# Patient Record
Sex: Female | Born: 1960
Health system: Southern US, Community
[De-identification: ages and names within clinical notes are randomized; demographics above are authoritative.]

## PROBLEM LIST (undated history)

## (undated) DIAGNOSIS — T7840XA Allergy, unspecified, initial encounter: Secondary | ICD-10-CM

## (undated) DIAGNOSIS — I10 Essential (primary) hypertension: Secondary | ICD-10-CM

## (undated) HISTORY — DX: Allergy, unspecified, initial encounter: T78.40XA

## (undated) HISTORY — DX: Essential (primary) hypertension: I10

---

## 1999-09-15 ENCOUNTER — Other Ambulatory Visit: Admission: RE | Admit: 1999-09-15 | Discharge: 1999-09-15 | Payer: Self-pay | Admitting: *Deleted

## 2000-06-19 ENCOUNTER — Other Ambulatory Visit: Admission: RE | Admit: 2000-06-19 | Discharge: 2000-06-19 | Payer: Self-pay | Admitting: *Deleted

## 2000-06-19 ENCOUNTER — Encounter (INDEPENDENT_AMBULATORY_CARE_PROVIDER_SITE_OTHER): Payer: Self-pay

## 2001-01-01 ENCOUNTER — Other Ambulatory Visit: Admission: RE | Admit: 2001-01-01 | Discharge: 2001-01-01 | Payer: Self-pay | Admitting: *Deleted

## 2001-12-18 ENCOUNTER — Other Ambulatory Visit: Admission: RE | Admit: 2001-12-18 | Discharge: 2001-12-18 | Payer: Self-pay | Admitting: *Deleted

## 2003-01-26 ENCOUNTER — Other Ambulatory Visit: Admission: RE | Admit: 2003-01-26 | Discharge: 2003-01-26 | Payer: Self-pay | Admitting: *Deleted

## 2004-01-12 ENCOUNTER — Other Ambulatory Visit: Admission: RE | Admit: 2004-01-12 | Discharge: 2004-01-12 | Payer: Self-pay | Admitting: *Deleted

## 2005-08-03 ENCOUNTER — Other Ambulatory Visit: Admission: RE | Admit: 2005-08-03 | Discharge: 2005-08-03 | Payer: Self-pay | Admitting: *Deleted

## 2006-09-03 ENCOUNTER — Other Ambulatory Visit: Admission: RE | Admit: 2006-09-03 | Discharge: 2006-09-03 | Payer: Self-pay | Admitting: *Deleted

## 2006-11-30 ENCOUNTER — Emergency Department (HOSPITAL_COMMUNITY): Admission: EM | Admit: 2006-11-30 | Discharge: 2006-11-30 | Payer: Self-pay | Admitting: Emergency Medicine

## 2007-10-23 ENCOUNTER — Other Ambulatory Visit: Admission: RE | Admit: 2007-10-23 | Discharge: 2007-10-23 | Payer: Self-pay | Admitting: *Deleted

## 2012-05-07 ENCOUNTER — Telehealth: Payer: Self-pay | Admitting: *Deleted

## 2012-05-07 NOTE — Telephone Encounter (Signed)
Confirmed genetic appt w/ pt.

## 2012-06-24 ENCOUNTER — Ambulatory Visit (HOSPITAL_BASED_OUTPATIENT_CLINIC_OR_DEPARTMENT_OTHER): Payer: BC Managed Care – PPO | Admitting: Genetic Counselor

## 2012-06-24 ENCOUNTER — Encounter: Payer: Self-pay | Admitting: Genetic Counselor

## 2012-06-24 ENCOUNTER — Other Ambulatory Visit: Payer: BC Managed Care – PPO | Admitting: Lab

## 2012-06-24 DIAGNOSIS — Z803 Family history of malignant neoplasm of breast: Secondary | ICD-10-CM

## 2012-06-24 DIAGNOSIS — Z808 Family history of malignant neoplasm of other organs or systems: Secondary | ICD-10-CM

## 2012-06-24 DIAGNOSIS — Z8481 Family history of carrier of genetic disease: Secondary | ICD-10-CM

## 2012-06-24 DIAGNOSIS — Z800 Family history of malignant neoplasm of digestive organs: Secondary | ICD-10-CM

## 2012-06-24 NOTE — Progress Notes (Signed)
Dr.  Teodora Medici requested a consultation for genetic counseling and risk assessment for Barbara Gonzalez, a 51 y.o. female, for discussion of her family history of a BRCA2 mutation. She presents to clinic today to discuss the possibility of a genetic predisposition to cancer, and to further clarify her risks, as well as her family members' risks for cancer.   HISTORY OF PRESENT ILLNESS: Barbara Gonzalez is a 51 y.o. female with no personal history of cancer.    History reviewed. No pertinent past medical history.  History reviewed. No pertinent past surgical history.  History  Substance Use Topics  . Smoking status: Never Smoker   . Smokeless tobacco: Never Used  . Alcohol Use: Yes     Comment: socially    REPRODUCTIVE HISTORY AND PERSONAL RISK ASSESSMENT FACTORS: Menarche was at age 17.   Premenopausal Uterus Intact: No Ovaries Intact: Yes [id number] , first live birth at age N/A  She has not previously undergone treatment for infertility.   OCP use for 15+ years   She has not used HRT in the past.    FAMILY HISTORY:  We obtained a detailed, 4-generation family history.  Significant diagnoses are listed below: Family History  Problem Relation Age of Onset  . Breast cancer Mother 64  . Thyroid cancer Mother 28  . Colon cancer Mother 65  . Breast cancer Sister 59    maternal half sister  . BRCA 1/2 Sister     BRCA2 positive  . Cancer Maternal Aunt     unknown cancer  . Kidney cancer Maternal Uncle   . Colon cancer Maternal Grandmother   . Cancer Maternal Aunt     unknown cancer  . Breast cancer Other     3 maternal great aunts with breast cancer  The patient's maternal half sister was diagnosed with breast cancer at age 71.  She was found to have a Suspected deleterious BRCA2 mutation.  The patient has two brothers, one full and one half, who are healthy.  The patient's mother was diagnosed with thyroid cancer and colon cancer at age 67 and breast cancer at 60.  She has a brother  and two sisters.  The two sisters died of unknown cancers.  The patient's maternal grandmother had colon cancer and died at 70.  This grandmother had three sisters who had breast cancer.  Patient's maternal ancestors are of African American descent, and paternal ancestors are of African American descent. There is no reported Ashkenazi Jewish ancestry. There is no  known consanguinity.  GENETIC COUNSELING RISK ASSESSMENT, DISCUSSION, AND SUGGESTED FOLLOW UP: We reviewed the natural history and genetic etiology of sporadic, familial and hereditary cancer syndromes.  We discussed that she would be tested for the BRCA2 mutation that was found in her sister.  Because she and her sister are half siblings, her risk for testing positive for the mutation found in her sister is 25%.  We discussed that we would bring her in to discuss her findings if she did test positive.  The patient's family history is suggestive of the following possible diagnosis: hereditary breast and ovarian cancer syndrome (HBOC)  We discussed that identification of a hereditary cancer syndrome may help her care providers tailor the patients medical management. If a mutation indicating HBOC is detected in this case, the [employer] recommendations would include increase cancer surveillance and possible prophylactic surgery. If a mutation is detected, the patient will be referred back to the referring provider and to any  additional appropriate care providers to discuss the relevant options.   If a mutation is not found in the patient, cancer surveillance options would be discussed for the patient according to the appropriate standard National Comprehensive Cancer Network and American Cancer Society guidelines, with consideration of their personal and family history risk factors. In this case, the patient will be referred back to their care providers for discussions of management.   After considering the risks,  benefits, and limitations, the patient provided informed consent for  the following  testing: single site BRCA2 testing through [facility].   Per the patient's request, we will contact her by telephone to discuss these results. A follow up genetic counseling visit will be scheduled if indicated.  The patient was seen for a total of 60 minutes, greater than 50% of which was spent face-to-face counseling.  This plan is being carried out per Dr. Teodora Medici recommendations.  This note will also be sent to the referring provider via the electronic medical record. The patient will be supplied with a summary of this genetic counseling discussion as well as educational information on the discussed hereditary cancer syndromes following the conclusion of their visit.   Patient was discussed with Dr. Drue Second.  _______________________________________________________________________ For Office Staff:  Number of people involved in session: 3 Was an Intern/ student involved with case: yes

## 2012-07-05 ENCOUNTER — Telehealth: Payer: Self-pay | Admitting: Genetic Counselor

## 2012-07-05 ENCOUNTER — Encounter: Payer: Self-pay | Admitting: Genetic Counselor

## 2012-07-05 NOTE — Telephone Encounter (Signed)
Discussed negative single site testing.  Also discussed next steps in testing her family, including testing her mother, or if she refuses, testing her two brothers.  She will talk with her sister about talking to their mother about testing.

## 2012-07-30 ENCOUNTER — Encounter: Payer: Self-pay | Admitting: Genetic Counselor

## 2013-04-02 ENCOUNTER — Other Ambulatory Visit: Payer: Self-pay | Admitting: Gynecology

## 2013-04-02 DIAGNOSIS — Z803 Family history of malignant neoplasm of breast: Secondary | ICD-10-CM

## 2013-05-13 ENCOUNTER — Other Ambulatory Visit: Payer: BC Managed Care – PPO

## 2013-05-22 ENCOUNTER — Ambulatory Visit
Admission: RE | Admit: 2013-05-22 | Discharge: 2013-05-22 | Disposition: A | Discharge status: Home / Self Care | Payer: BC Managed Care – PPO | Source: Ambulatory Visit | Attending: Gynecology | Admitting: Gynecology

## 2013-05-22 DIAGNOSIS — Z803 Family history of malignant neoplasm of breast: Secondary | ICD-10-CM

## 2013-05-22 MED ORDER — GADOBENATE DIMEGLUMINE 529 MG/ML IV SOLN
15.0000 mL | Freq: Once | INTRAVENOUS | Status: AC | PRN
  Administered 2013-05-22: 15 mL via INTRAVENOUS

## 2013-12-07 ENCOUNTER — Ambulatory Visit (INDEPENDENT_AMBULATORY_CARE_PROVIDER_SITE_OTHER): Payer: BC Managed Care – PPO | Admitting: Physician Assistant

## 2013-12-07 VITALS — BP 120/70 | HR 71 | Temp 98.4°F | Resp 12 | Ht 62.0 in | Wt 170.0 lb

## 2013-12-07 DIAGNOSIS — R059 Cough, unspecified: Secondary | ICD-10-CM

## 2013-12-07 DIAGNOSIS — R05 Cough: Secondary | ICD-10-CM

## 2013-12-07 DIAGNOSIS — R0602 Shortness of breath: Secondary | ICD-10-CM

## 2013-12-07 DIAGNOSIS — J329 Chronic sinusitis, unspecified: Secondary | ICD-10-CM

## 2013-12-07 MED ORDER — HYDROCODONE-HOMATROPINE 5-1.5 MG/5ML PO SYRP: 5.0000 mL | ORAL_SOLUTION | Freq: Three times a day (TID) | ORAL | Status: DC | PRN

## 2013-12-07 MED ORDER — PREDNISONE 20 MG PO TABS: 20.0000 mg | ORAL_TABLET | Freq: Every day | ORAL | Status: DC

## 2013-12-07 MED ORDER — AMOXICILLIN-POT CLAVULANATE 875-125 MG PO TABS: 1.0000 | ORAL_TABLET | Freq: Two times a day (BID) | ORAL | Status: DC

## 2013-12-07 NOTE — Progress Notes (Signed)
   Subjective:    Patient ID: Barbara Gonzalez, female    DOB: [birth year], 53 y.o.   MRN: [id number]  HPI 53 year old female presents for evaluation of 1 week history of progressively worsening sinus pain/pressure, nasal congestion, rhinorrhea, PND, and cough. Has now developed SOB and fatigue. Subjective fever last night but not documented.  Hx of seasonal allergies normally treated with Zyrtec prn.  Denies hemoptysis, nausea, vomiting, dizziness, sore throat, or wheezing.  Does have slight ear fullness/pain and sinus headache.  She has been taking Zyrtec, Alka seltzer and ibuprofen which have not been helping.  Patient is otherwise doing well with no other concerns today.      Review of Systems  Constitutional: Positive for fever (subjective) and chills.  HENT: Positive for congestion, ear pain, postnasal drip, rhinorrhea and sinus pressure. Negative for sore throat.   Respiratory: Positive for cough and shortness of breath. Negative for chest tightness and wheezing.   Cardiovascular: Negative for chest pain.  Gastrointestinal: Negative for nausea and vomiting.  Neurological: Negative for dizziness and headaches.       Objective:   Physical Exam  Constitutional: She is oriented to person, place, and time. She appears well-developed and well-nourished.  HENT:  Head: Normocephalic and atraumatic.  Right Ear: Hearing, tympanic membrane, external ear and ear canal normal.  Left Ear: Hearing, tympanic membrane, external ear and ear canal normal.  Nose: Right sinus exhibits maxillary sinus tenderness. Right sinus exhibits no frontal sinus tenderness. Left sinus exhibits maxillary sinus tenderness. Left sinus exhibits no frontal sinus tenderness.  Mouth/Throat: Uvula is midline, oropharynx is clear and moist and mucous membranes are normal.  Eyes: Conjunctivae are normal.  Neck: Normal range of motion. Neck supple.  Cardiovascular: Normal rate, regular rhythm and normal heart sounds.     Pulmonary/Chest: Effort normal and breath sounds normal.  Lymphadenopathy:    She has no cervical adenopathy.  Neurological: She is alert and oriented to person, place, and time.  Psychiatric: She has a normal mood and affect. Her behavior is normal. Judgment and thought content normal.          Assessment & Plan:  Sinus infection - Plan: amoxicillin-clavulanate (AUGMENTIN) 875-125 MG per tablet  Cough - Plan: HYDROcodone-homatropine (HYCODAN) 5-1.5 MG/5ML syrup  SOB (shortness of breath) - Plan: predniSONE (DELTASONE) 20 MG tablet  Will treat as acute maxillary sinusitis with Augmentin 875 mg bid x 10 days and prednisone taper Hycodan q8hours prn cough. Continue Zyrtec daily  Push fluids Out of work tomorrow if needed.  Follow up if symptoms worsening or fail to improve.

## 2014-02-04 ENCOUNTER — Ambulatory Visit (INDEPENDENT_AMBULATORY_CARE_PROVIDER_SITE_OTHER): Payer: BC Managed Care – PPO | Admitting: Family Medicine

## 2014-02-04 VITALS — BP 128/86 | HR 81 | Temp 97.9°F | Resp 16 | Ht 62.25 in | Wt 170.8 lb

## 2014-02-04 DIAGNOSIS — T798XXA Other early complications of trauma, initial encounter: Secondary | ICD-10-CM

## 2014-02-04 DIAGNOSIS — T148XXA Other injury of unspecified body region, initial encounter: Secondary | ICD-10-CM

## 2014-02-04 DIAGNOSIS — L089 Local infection of the skin and subcutaneous tissue, unspecified: Secondary | ICD-10-CM

## 2014-02-04 MED ORDER — DOXYCYCLINE HYCLATE 100 MG PO TABS: 100.0000 mg | ORAL_TABLET | Freq: Two times a day (BID) | ORAL | Status: DC

## 2014-02-04 NOTE — Progress Notes (Signed)
Chief Complaint:  Chief Complaint  Patient presents with  . Knee Pain    L knee wound- injured scrubbing floors 8-10 days ago. sore/ yellow drainage    HPI: Barbara Gonzalez is a 53 y.o. female who is here for  10 day history of left wound on knee after on knees scrubbing the floor, she had it on her other knee as well but the wound healed. She had what sounded like a . Black eschar  And it was draining serosainguinous fluid and since it was draining she just pulled it off. She states the borders are slightly "red and puffy and tender". No fevers or chills. She has been putting alchol on it. Ther knee healed well.   Past Medical History  Diagnosis Date  . Hypertension   . Allergy    History reviewed. No pertinent past surgical history. History   Social History  . Marital Status: Single    Spouse Name: N/A    Number of Children: N/A  . Years of Education: N/A   Social History Main Topics  . Smoking status: Never Smoker   . Smokeless tobacco: Never Used  . Alcohol Use: Yes     Comment: socially  . Drug Use: No  . Sexual Activity:    Other Topics Concern  . None   Social History Narrative  . None   Family History  Problem Relation Age of Onset  . Breast cancer Mother 71  . Thyroid cancer Mother 71  . Colon cancer Mother 40  . Breast cancer Sister 46    maternal half sister  . BRCA 1/2 Sister     BRCA2 positive  . Cancer Maternal Aunt     unknown cancer  . Kidney cancer Maternal Uncle   . Colon cancer Maternal Grandmother   . Cancer Maternal Aunt     unknown cancer  . Breast cancer Other     3 maternal great aunts with breast cancer   Allergies  Allergen Reactions  . Sulfa Antibiotics Itching   Prior to Admission medications   Medication Sig Start Date End Date Taking? Authorizing Provider  lisinopril (PRINIVIL,ZESTRIL) 5 MG tablet Take 5 mg by mouth daily.   Yes Historical Provider, MD  LORazepam (ATIVAN) 2 MG tablet Take 2 mg by mouth every 6 (six)  hours as needed for anxiety.   Yes Historical Provider, MD  phentermine 15 MG capsule Take 15 mg by mouth every morning.   Yes Historical Provider, MD  traZODone (DESYREL) 50 MG tablet Take 50 mg by mouth at bedtime.   Yes Historical Provider, MD  amoxicillin-clavulanate (AUGMENTIN) 875-125 MG per tablet Take 1 tablet by mouth 2 (two) times daily. 12/07/13   Heather M Marte, PA-C  HYDROcodone-homatropine (HYCODAN) 5-1.5 MG/5ML syrup Take 5 mLs by mouth every 8 (eight) hours as needed for cough. 12/07/13   Collene Leyden, PA-C  predniSONE (DELTASONE) 20 MG tablet Take 1 tablet (20 mg total) by mouth daily with breakfast. Take 2 tablets for 3 days, then 1 tablet for 3 days 12/07/13   Collene Leyden, PA-C     ROS: The patient denies fevers, chills, night sweats, unintentional weight loss, chest pain, palpitations, wheezing, dyspnea on exertion, nausea, vomiting, abdominal pain, dysuria, hematuria, melena, numbness, weakness, or tingling.   All other systems have been reviewed and were otherwise negative with the exception of those mentioned in the HPI and as above.    PHYSICAL EXAM: Filed Vitals:  02/04/14 1745  BP: 128/86  Pulse: 81  Temp: 97.9 F (36.6 C)  Resp: 16   Filed Vitals:   02/04/14 1745  Height: 5' 2.25" (1.581 m)  Weight: 170 lb 12.8 oz (77.474 kg)   Body mass index is 31 kg/(m^2).  General: Alert, no acute distress HEENT:  Normocephalic, atraumatic, oropharynx patent. EOMI, PERRLA Cardiovascular:  Regular rate and rhythm, no rubs murmurs or gallops.  No Carotid bruits, radial pulse intact. No pedal edema.  Respiratory: Clear to auscultation bilaterally.  No wheezes, rales, or rhonchi.  No cyanosis, no use of accessory musculature GI: No organomegaly, abdomen is soft and non-tender, positive bowel sounds.  No masses. Skin: + left wound on knee , healing well, she has a dry wound with minimal erythema form wound contracture and healng. It is dry and clean. No appreciable  warmth. There is minimal redness and fluid around borders to be expected Neurologic: Facial musculature symmetric. Psychiatric: Patient is appropriate throughout our interaction. Lymphatic: No cervical lymphadenopathy Musculoskeletal: Gait intact.   LABS: No results found for this or any previous visit.   EKG/XRAY:   Primary read interpreted by Dr. Marin Comment at [facility].   ASSESSMENT/PLAN: Encounter Diagnosis  Name Primary?  . Post-traumatic wound infection, initial encounter Yes   IF no improvement then will take doxycyline, gave pt prescriptions to hold onto.I suspect she will not need it.  Wound care as directed/precautions given for worsening sxs and how long it will take to heal F/u prn  Gross sideeffects, risk and benefits, and alternatives of medications d/w patient. Patient is aware that all medications have potential sideeffects and we are unable to predict every sideeffect or drug-drug interaction that may occur.  Leotis Pain, DO 02/04/2014 6:33 PM

## 2014-10-08 ENCOUNTER — Ambulatory Visit (INDEPENDENT_AMBULATORY_CARE_PROVIDER_SITE_OTHER): Payer: BLUE CROSS/BLUE SHIELD | Admitting: Family Medicine

## 2014-10-08 ENCOUNTER — Encounter: Payer: Self-pay | Admitting: Family Medicine

## 2014-10-08 VITALS — BP 118/80 | HR 73 | Temp 97.4°F | Resp 16 | Ht 62.5 in | Wt 170.6 lb

## 2014-10-08 DIAGNOSIS — M546 Pain in thoracic spine: Secondary | ICD-10-CM | POA: Diagnosis not present

## 2014-10-08 DIAGNOSIS — R0789 Other chest pain: Secondary | ICD-10-CM

## 2014-10-08 DIAGNOSIS — M79602 Pain in left arm: Secondary | ICD-10-CM

## 2014-10-08 DIAGNOSIS — F419 Anxiety disorder, unspecified: Secondary | ICD-10-CM | POA: Diagnosis not present

## 2014-10-08 DIAGNOSIS — M25512 Pain in left shoulder: Secondary | ICD-10-CM

## 2014-10-08 DIAGNOSIS — M25511 Pain in right shoulder: Secondary | ICD-10-CM | POA: Diagnosis not present

## 2014-10-08 DIAGNOSIS — M549 Dorsalgia, unspecified: Secondary | ICD-10-CM

## 2014-10-08 MED ORDER — CYCLOBENZAPRINE HCL 5 MG PO TABS: 10.0000 mg | ORAL_TABLET | Freq: Every day | ORAL | Status: DC

## 2014-10-08 MED ORDER — DICLOFENAC SODIUM 75 MG PO TBEC: 75.0000 mg | DELAYED_RELEASE_TABLET | Freq: Two times a day (BID) | ORAL | Status: DC

## 2014-10-08 NOTE — Patient Instructions (Signed)
Do regular stretching of your upper back and shoulders as instructed  Take the cyclobenzaprine 5 mg 1 at bedtime for muscle relaxant  Take the diclofenac one twice daily at breakfast and supper for pain and inflammation  Try get some regular daily exercise such as taking a good walk every day  Return if any time if acutely worse, or if not improving over the next few weeks.

## 2014-10-08 NOTE — Progress Notes (Signed)
Subjective: Patient been having some pain in her left upper arm for the last couple of weeks. It on nipping to the shoulder and mid upper back. Today it also hurt her in the right upper shoulder and back. She's had some pressure-like feelings in her chest when she takes a deep breath. She is on blood pressure medications. She also has a history of anxiety and has been on medications for that. She is able to run up and down the stairs at home fine without pains. She has no excessive shortness of breath. She does not get any regular exercise. She works a Office managerdesk job. She has had no nausea or vomiting or GI complaints.  Objective: Pleasant, slightly anxious appearing lady in no acute distress. Her neck is supple. Chest clear to auscultation. Heart regular without murmurs. Chest wall is a little bit tender anteriorly on palpation. She's also tender and tight in the trapezius muscles of her upper back.  Assessment: Upper arm pains Shoulder pains Chest pain Anxiety  Plan: EKG Normal EKG  Treat with diclofenac and Robaxin and stretches

## 2014-11-04 ENCOUNTER — Emergency Department (HOSPITAL_COMMUNITY): Payer: BLUE CROSS/BLUE SHIELD

## 2014-11-04 ENCOUNTER — Encounter (HOSPITAL_COMMUNITY): Payer: Self-pay | Admitting: Emergency Medicine

## 2014-11-04 ENCOUNTER — Observation Stay (HOSPITAL_COMMUNITY)
Admission: EM | Admit: 2014-11-04 | Discharge: 2014-11-04 | Disposition: A | Discharge status: Home / Self Care | Payer: BLUE CROSS/BLUE SHIELD | Attending: Internal Medicine | Admitting: Internal Medicine

## 2014-11-04 DIAGNOSIS — I10 Essential (primary) hypertension: Secondary | ICD-10-CM | POA: Diagnosis not present

## 2014-11-04 DIAGNOSIS — F411 Generalized anxiety disorder: Secondary | ICD-10-CM | POA: Diagnosis not present

## 2014-11-04 DIAGNOSIS — R0789 Other chest pain: Principal | ICD-10-CM | POA: Insufficient documentation

## 2014-11-04 DIAGNOSIS — Z803 Family history of malignant neoplasm of breast: Secondary | ICD-10-CM | POA: Insufficient documentation

## 2014-11-04 DIAGNOSIS — R079 Chest pain, unspecified: Secondary | ICD-10-CM | POA: Diagnosis not present

## 2014-11-04 DIAGNOSIS — Z881 Allergy status to other antibiotic agents status: Secondary | ICD-10-CM | POA: Diagnosis not present

## 2014-11-04 DIAGNOSIS — Z800 Family history of malignant neoplasm of digestive organs: Secondary | ICD-10-CM | POA: Diagnosis not present

## 2014-11-04 DIAGNOSIS — Z808 Family history of malignant neoplasm of other organs or systems: Secondary | ICD-10-CM | POA: Diagnosis not present

## 2014-11-04 LAB — BASIC METABOLIC PANEL
ANION GAP: 8 (ref 5–15)
BUN: 14 mg/dL (ref 6–23)
CO2: 26 mmol/L (ref 19–32)
Calcium: 9.1 mg/dL (ref 8.4–10.5)
Chloride: 105 mmol/L (ref 96–112)
Creatinine, Ser: 1.06 mg/dL (ref 0.50–1.10)
GFR, EST AFRICAN AMERICAN: 68 mL/min — AB (ref >90)
GFR, EST NON AFRICAN AMERICAN: 59 mL/min — AB (ref >90)
Glucose, Bld: 98 mg/dL (ref 70–99)
POTASSIUM: 4.2 mmol/L (ref 3.5–5.1)
SODIUM: 139 mmol/L (ref 135–145)

## 2014-11-04 LAB — CBC WITH DIFFERENTIAL/PLATELET
BASOS ABS: 0 10*3/uL (ref 0.0–0.1)
BASOS PCT: 0 % (ref 0–1)
EOS ABS: 0.3 10*3/uL (ref 0.0–0.7)
EOS PCT: 3 % (ref 0–5)
HCT: 39.6 % (ref 36.0–46.0)
HEMOGLOBIN: 13.2 g/dL (ref 12.0–15.0)
Lymphocytes Relative: 24 % (ref 12–46)
Lymphs Abs: 2.7 10*3/uL (ref 0.7–4.0)
MCH: 32 pg (ref 26.0–34.0)
MCHC: 33.3 g/dL (ref 30.0–36.0)
MCV: 95.9 fL (ref 78.0–100.0)
Monocytes Absolute: 1 10*3/uL (ref 0.1–1.0)
Monocytes Relative: 9 % (ref 3–12)
Neutro Abs: 7.4 10*3/uL (ref 1.7–7.7)
Neutrophils Relative %: 64 % (ref 43–77)
PLATELETS: 290 10*3/uL (ref 150–400)
RBC: 4.13 MIL/uL (ref 3.87–5.11)
RDW: 13.7 % (ref 11.5–15.5)
WBC: 11.4 10*3/uL — ABNORMAL HIGH (ref 4.0–10.5)

## 2014-11-04 LAB — I-STAT CHEM 8, ED
BUN: 18 mg/dL (ref 6–23)
CALCIUM ION: 1.14 mmol/L (ref 1.12–1.23)
CHLORIDE: 102 mmol/L (ref 96–112)
Creatinine, Ser: 1.1 mg/dL (ref 0.50–1.10)
Glucose, Bld: 98 mg/dL (ref 70–99)
HEMATOCRIT: 44 % (ref 36.0–46.0)
Hemoglobin: 15 g/dL (ref 12.0–15.0)
POTASSIUM: 5.3 mmol/L — AB (ref 3.5–5.1)
Sodium: 139 mmol/L (ref 135–145)
TCO2: 24 mmol/L (ref 0–100)

## 2014-11-04 LAB — TROPONIN I
Troponin I: <0.03 ng/mL
Troponin I: <0.03 ng/mL (ref <0.031)
Troponin I: <0.03 ng/mL

## 2014-11-04 LAB — I-STAT TROPONIN, ED: TROPONIN I, POC: 0 ng/mL (ref 0.00–0.08)

## 2014-11-04 MED ORDER — LISINOPRIL-HYDROCHLOROTHIAZIDE 20-12.5 MG PO TABS: 1.0000 | ORAL_TABLET | Freq: Every day | ORAL | Status: DC

## 2014-11-04 MED ORDER — HEPARIN SODIUM (PORCINE) 5000 UNIT/ML IJ SOLN
5000.0000 [IU] | Freq: Three times a day (TID) | INTRAMUSCULAR | Status: DC
  Administered 2014-11-04: 5000 [IU] via SUBCUTANEOUS
  Filled 2014-11-04: qty 1

## 2014-11-04 MED ORDER — TRAZODONE HCL 50 MG PO TABS: 100.0000 mg | ORAL_TABLET | Freq: Every day | ORAL | Status: DC

## 2014-11-04 MED ORDER — TRAZODONE HCL 50 MG PO TABS: 50.0000 mg | ORAL_TABLET | Freq: Every day | ORAL | Status: DC

## 2014-11-04 MED ORDER — ASPIRIN 325 MG PO TABS
325.0000 mg | ORAL_TABLET | Freq: Once | ORAL | Status: AC
  Administered 2014-11-04: 325 mg via ORAL
  Filled 2014-11-04: qty 1

## 2014-11-04 MED ORDER — LORAZEPAM 1 MG PO TABS
2.0000 mg | ORAL_TABLET | Freq: Four times a day (QID) | ORAL | Status: DC | PRN
Start: 2014-11-04 — End: 2014-11-04

## 2014-11-04 MED ORDER — LORAZEPAM 1 MG PO TABS: 1.0000 mg | ORAL_TABLET | Freq: Two times a day (BID) | ORAL | Status: DC | PRN

## 2014-11-04 MED ORDER — OMEPRAZOLE 20 MG PO CPDR: 20.0000 mg | DELAYED_RELEASE_CAPSULE | Freq: Every day | ORAL | Status: DC

## 2014-11-04 MED ORDER — ONDANSETRON HCL 4 MG/2ML IJ SOLN: 4.0000 mg | Freq: Four times a day (QID) | INTRAMUSCULAR | Status: DC | PRN

## 2014-11-04 MED ORDER — HYDROCHLOROTHIAZIDE 25 MG PO TABS
25.0000 mg | ORAL_TABLET | Freq: Every day | ORAL | Status: DC
  Administered 2014-11-04: 25 mg via ORAL
  Filled 2014-11-04: qty 1

## 2014-11-04 MED ORDER — DICLOFENAC SODIUM 75 MG PO TBEC: 75.0000 mg | DELAYED_RELEASE_TABLET | Freq: Two times a day (BID) | ORAL | Status: DC

## 2014-11-04 MED ORDER — NITROGLYCERIN 0.4 MG SL SUBL
0.4000 mg | SUBLINGUAL_TABLET | SUBLINGUAL | Status: AC | PRN
  Administered 2014-11-04 (×3): 0.4 mg via SUBLINGUAL
  Filled 2014-11-04: qty 1

## 2014-11-04 MED ORDER — KETOROLAC TROMETHAMINE 15 MG/ML IJ SOLN
15.0000 mg | Freq: Once | INTRAMUSCULAR | Status: AC
  Administered 2014-11-04: 15 mg via INTRAVENOUS
  Filled 2014-11-04: qty 1

## 2014-11-04 MED ORDER — CYCLOBENZAPRINE HCL 10 MG PO TABS: 10.0000 mg | ORAL_TABLET | Freq: Every day | ORAL | Status: DC

## 2014-11-04 MED ORDER — DOXYLAMINE SUCCINATE (SLEEP) 25 MG PO TABS
50.0000 mg | ORAL_TABLET | Freq: Every evening | ORAL | Status: DC | PRN
  Filled 2014-11-04: qty 2

## 2014-11-04 MED ORDER — LISINOPRIL-HYDROCHLOROTHIAZIDE 20-25 MG PO TABS: 1.0000 | ORAL_TABLET | Freq: Every day | ORAL | Status: DC

## 2014-11-04 MED ORDER — ASPIRIN EC 325 MG PO TBEC
325.0000 mg | DELAYED_RELEASE_TABLET | Freq: Every day | ORAL | Status: DC
  Administered 2014-11-04: 325 mg via ORAL
  Filled 2014-11-04: qty 1

## 2014-11-04 MED ORDER — LORAZEPAM 1 MG PO TABS
1.0000 mg | ORAL_TABLET | Freq: Two times a day (BID) | ORAL | Status: DC | PRN
Start: 2014-11-04 — End: 2014-11-04

## 2014-11-04 MED ORDER — LISINOPRIL 20 MG PO TABS
20.0000 mg | ORAL_TABLET | Freq: Every day | ORAL | Status: DC
  Administered 2014-11-04: 20 mg via ORAL
  Filled 2014-11-04: qty 1

## 2014-11-04 MED ORDER — ACETAMINOPHEN 325 MG PO TABS: 650.0000 mg | ORAL_TABLET | ORAL | Status: DC | PRN

## 2014-11-04 MED ORDER — IBUPROFEN 200 MG PO TABS: 600.0000 mg | ORAL_TABLET | Freq: Four times a day (QID) | ORAL | Status: DC | PRN

## 2014-11-04 NOTE — ED Notes (Signed)
Bed: WA15 Expected date:  Expected time:  Means of arrival:  Comments: 

## 2014-11-04 NOTE — Discharge Instructions (Signed)

## 2014-11-04 NOTE — ED Notes (Signed)
Patient is resting quietly. Appears in no acute distress. Respirations are even, regular, and unlabored. Skin is warm and dry. Dr. Mora Bellmanni is aware of patients non-changed chest pain.

## 2014-11-04 NOTE — H&P (Signed)
[facility] History and Physical  Pranavi Aure WUJ:[id number] DOB: [birth year] DOA: 11/04/2014  Referring physician: EDP PCP: No PCP Per Patient   Chief Complaint: Chest pain   HPI: Barbara Gonzalez is a 54 y.o. female with h/o HTN.  Patient presents to ED today with c/o chest pain.  Patient states this has been ongoing for the past 10 days and progressively worsening.  Pain is left sided, quality of a pressure like sensation.  It is associated with SOB.  She had similar symptoms earlier this month on the right side of her chest.  Patient has had increased stress recently, initially thought that this was just anxiety but has lasted much longer than typical anxiety symptoms.  No fevers, no cough, no risk factors for PE.  Review of Systems: Systems reviewed.  As above, otherwise negative  Past Medical History  Diagnosis Date  . Hypertension   . Allergy    History reviewed. No pertinent past surgical history. Social History:  reports that she has never smoked. She has never used smokeless tobacco. She reports that she drinks alcohol. She reports that she does not use illicit drugs.  Allergies  Allergen Reactions  . Sulfa Antibiotics Itching    Family History  Problem Relation Age of Onset  . Breast cancer Mother 63  . Thyroid cancer Mother 62  . Colon cancer Mother 75  . Breast cancer Sister 24    maternal half sister  . BRCA 1/2 Sister     BRCA2 positive  . Cancer Maternal Aunt     unknown cancer  . Kidney cancer Maternal Uncle   . Colon cancer Maternal Grandmother   . Cancer Maternal Aunt     unknown cancer  . Breast cancer Other     3 maternal great aunts with breast cancer     Prior to Admission medications   Medication Sig Start Date End Date Taking? Authorizing Provider  cyclobenzaprine (FLEXERIL) 5 MG tablet Take 2 tablets (10 mg total) by mouth at bedtime. 10/08/14   Posey Boyer, MD  diclofenac (VOLTAREN) 75 MG EC tablet Take 1 tablet (75 mg total) by mouth 2  (two) times daily. 10/08/14   Posey Boyer, MD  lisinopril-hydrochlorothiazide (PRINZIDE,ZESTORETIC) 20-12.5 MG per tablet Take 1 tablet by mouth daily.    Historical Provider, MD  LORazepam (ATIVAN) 2 MG tablet Take 2 mg by mouth every 6 (six) hours as needed for anxiety.    Historical Provider, MD  traZODone (DESYREL) 50 MG tablet Take 50 mg by mouth at bedtime.    Historical Provider, MD   Physical Exam: Filed Vitals:   11/04/14 0318  BP: 111/72  Pulse: 91  Temp:   Resp:     BP 111/72 mmHg  Pulse 91  Temp(Src) 98.7 F (37.1 C) (Oral)  Resp 17  SpO2 100%  General Appearance:    Alert, oriented, no distress, appears stated age  Head:    Normocephalic, atraumatic  Eyes:    PERRL, EOMI, sclera non-icteric        Nose:   Nares without drainage or epistaxis. Mucosa, turbinates normal  Throat:   Moist mucous membranes. Oropharynx without erythema or exudate.  Neck:   Supple. No carotid bruits.  No thyromegaly.  No lymphadenopathy.   Back:     No CVA tenderness, no spinal tenderness  Lungs:     Clear to auscultation bilaterally, without wheezes, rhonchi or rales  Chest wall:    No tenderness to palpitation  Heart:  Regular rate and rhythm without murmurs, gallops, rubs  Abdomen:     Soft, non-tender, nondistended, normal bowel sounds, no organomegaly  Genitalia:    deferred  Rectal:    deferred  Extremities:   No clubbing, cyanosis or edema.  Pulses:   2+ and symmetric all extremities  Skin:   Skin color, texture, turgor normal, no rashes or lesions  Lymph nodes:   Cervical, supraclavicular, and axillary nodes normal  Neurologic:   CNII-XII intact. Normal strength, sensation and reflexes      throughout    Labs on Admission:  Basic Metabolic Panel:  Recent Labs Lab 11/04/14 0252 11/04/14 0259  NA 139 139  K 4.2 5.3*  CL 105 102  CO2 26  --   GLUCOSE 98 98  BUN 14 18  CREATININE 1.06 1.10  CALCIUM 9.1  --    Liver Function Tests: No results for input(s): AST,  ALT, ALKPHOS, BILITOT, PROT, ALBUMIN in the last 168 hours. No results for input(s): LIPASE, AMYLASE in the last 168 hours. No results for input(s): AMMONIA in the last 168 hours. CBC:  Recent Labs Lab 11/04/14 0252 11/04/14 0259  WBC 11.4*  --   NEUTROABS 7.4  --   HGB 13.2 15.0  HCT 39.6 44.0  MCV 95.9  --   PLT 290  --    Cardiac Enzymes: No results for input(s): CKTOTAL, CKMB, CKMBINDEX, TROPONINI in the last 168 hours.  BNP (last 3 results) No results for input(s): PROBNP in the last 8760 hours. CBG: No results for input(s): GLUCAP in the last 168 hours.  Radiological Exams on Admission: Dg Chest 2 View  11/04/2014   CLINICAL DATA:  Left upper chest pain for the past 10 days, worst this morning. Initial encounter.  EXAM: CHEST  2 VIEW  COMPARISON:  None.  FINDINGS: No cardiomegaly. There is mild aortic tortuosity without evidence of enlargement. The hila are negative. There is no edema, consolidation, effusion, or pneumothorax. No osseous findings to explain pain.  IMPRESSION: No active cardiopulmonary disease.   Electronically Signed   By: Monte Fantasia M.D.   On: 11/04/2014 02:39    EKG: Independently reviewed.  Assessment/Plan Active Problems:   Chest pain   Chest pain with low risk for cardiac etiology   1. Chest pain - HEART score 3 1. Chest pain obs pathway 2. Serial trops 3. NPO for possible stress test in AM.    Code Status: Full Code  Family Communication: No family in room Disposition Plan: Admit to obs   Time spent: 50 min  GARDNER, JARED M. [facility] Pager [phone]  If 7AM-7PM, please contact the day team taking care of the patient [email or web address] Password [email or web address] 11/04/2014, 3:45 AM

## 2014-11-04 NOTE — Progress Notes (Signed)
UR completed 

## 2014-11-04 NOTE — ED Provider Notes (Signed)
CSN: [id number]     Arrival date & time 11/04/14  0205 History   First MD Initiated Contact with Patient 11/04/14 [phone]     Chief Complaint  Patient presents with  . Chest Pain     (Consider location/radiation/quality/duration/timing/severity/associated sxs/prior Treatment) HPI  Barbara Gonzalez is a 54 y.o. female with past medical history of hypertension presenting today with chest pain. Patient states this has been going on for the past 10 days and progressively worsening. She describes it as left-sided and a weight being on her. Is associated shortness of breath. She has no emesis or diaphoresis. Patient is a history of anxiety and has had increased stress recently but her symptoms are not consistent with her normal anxiety. Her chest pain is worse on exertion. She denies risk factors for pulmonary embolism or any history of this. Patient had no fevers or cough. She has no further complaints.  10 Systems reviewed and are negative for acute change except as noted in the HPI.    Past Medical History  Diagnosis Date  . Hypertension   . Allergy    History reviewed. No pertinent past surgical history. Family History  Problem Relation Age of Onset  . Breast cancer Mother 26  . Thyroid cancer Mother 32  . Colon cancer Mother 5  . Breast cancer Sister 76    maternal half sister  . BRCA 1/2 Sister     BRCA2 positive  . Cancer Maternal Aunt     unknown cancer  . Kidney cancer Maternal Uncle   . Colon cancer Maternal Grandmother   . Cancer Maternal Aunt     unknown cancer  . Breast cancer Other     3 maternal great aunts with breast cancer   History  Substance Use Topics  . Smoking status: Never Smoker   . Smokeless tobacco: Never Used  . Alcohol Use: Yes     Comment: socially   OB History    No data available     Review of Systems    Allergies  Sulfa antibiotics  Home Medications   Prior to Admission medications   Medication Sig Start Date End Date Taking? Authorizing  Provider  cyclobenzaprine (FLEXERIL) 5 MG tablet Take 2 tablets (10 mg total) by mouth at bedtime. 10/08/14   Posey Boyer, MD  diclofenac (VOLTAREN) 75 MG EC tablet Take 1 tablet (75 mg total) by mouth 2 (two) times daily. 10/08/14   Posey Boyer, MD  lisinopril-hydrochlorothiazide (PRINZIDE,ZESTORETIC) 20-12.5 MG per tablet Take 1 tablet by mouth daily.    Historical Provider, MD  LORazepam (ATIVAN) 2 MG tablet Take 2 mg by mouth every 6 (six) hours as needed for anxiety.    Historical Provider, MD  traZODone (DESYREL) 50 MG tablet Take 50 mg by mouth at bedtime.    Historical Provider, MD   BP 134/83 mmHg  Pulse 96  Temp(Src) 98.7 F (37.1 C) (Oral)  Resp 17  SpO2 100% Physical Exam  Constitutional: She is oriented to person, place, and time. She appears well-developed and well-nourished. No distress.  HENT:  Head: Normocephalic and atraumatic.  Nose: Nose normal.  Mouth/Throat: Oropharynx is clear and moist. No oropharyngeal exudate.  Eyes: Conjunctivae and EOM are normal. Pupils are equal, round, and reactive to light. No scleral icterus.  Neck: Normal range of motion. Neck supple. No JVD present. No tracheal deviation present. No thyromegaly present.  Cardiovascular: Normal rate, regular rhythm and normal heart sounds.  Exam reveals no gallop and no  friction rub.   No murmur heard. Pulmonary/Chest: Effort normal and breath sounds normal. No respiratory distress. She has no wheezes. She exhibits no tenderness.  Abdominal: Soft. Bowel sounds are normal. She exhibits no distension and no mass. There is no tenderness. There is no rebound and no guarding.  Musculoskeletal: Normal range of motion. She exhibits no edema or tenderness.  Lymphadenopathy:    She has no cervical adenopathy.  Neurological: She is alert and oriented to person, place, and time. No cranial nerve deficit. She exhibits normal muscle tone.  Skin: Skin is warm and dry. No rash noted. No erythema. No pallor.   Nursing note and vitals reviewed.   ED Course  Procedures (including critical care time) Labs Review Labs Reviewed  CBC WITH DIFFERENTIAL/PLATELET - Abnormal; Notable for the following:    WBC 11.4 (*)    All other components within normal limits  BASIC METABOLIC PANEL - Abnormal; Notable for the following:    GFR calc non Af Amer 59 (*)    GFR calc Af Amer 68 (*)    All other components within normal limits  I-STAT CHEM 8, ED - Abnormal; Notable for the following:    Potassium 5.3 (*)    All other components within normal limits  I-STAT TROPOININ, ED    Imaging Review Dg Chest 2 View  11/04/2014   CLINICAL DATA:  Left upper chest pain for the past 10 days, worst this morning. Initial encounter.  EXAM: CHEST  2 VIEW  COMPARISON:  None.  FINDINGS: No cardiomegaly. There is mild aortic tortuosity without evidence of enlargement. The hila are negative. There is no edema, consolidation, effusion, or pneumothorax. No osseous findings to explain pain.  IMPRESSION: No active cardiopulmonary disease.   Electronically Signed   By: Monte Fantasia M.D.   On: 11/04/2014 02:39     EKG Interpretation   Date/Time:  Wednesday November 04 2014 02:13:55 EDT Ventricular Rate:  96 PR Interval:  162 QRS Duration: 90 QT Interval:  358 QTC Calculation: 452 R Axis:   -41 Text Interpretation:  Sinus rhythm Left axis deviation Poor R wave  progression Confirmed by Glynn Octave [phone]) on 11/04/2014 2:26:51  AM      MDM   Final diagnoses:  Chest pain    Patient presents emergency department for chest pain. Her history is concerning for ACS. He was given aspirin emergency department as well as nitroglycerin. EKG is nonischemic, labs are currently pending. Patient will require inpatient admission for evaluation of her chest pain. Dr. Alcario Drought with [facility] will admit the patient for cardiac work up.  Everlene Balls, MD 11/04/14 [phone]

## 2014-11-04 NOTE — ED Notes (Signed)
Pt reports having left sided chest pain that feels like something is sitting on her chest since Easter weekend. Pt reports it could be anxiety but she began feeling nauseated and SOB so decided to come in and get checked out. Pt denies cardiac hx.

## 2014-11-04 NOTE — Progress Notes (Signed)
Patient interviewed and examined in detail at ~ 8:30 AM. H/o HTN and no other significant cardiac or other PMH. Never smoked. No FH of heart disease. No h/o recent travel. No h/o stress test or cath. 3 week h/o intermittent CP - initially on R > seen at [facility] and improved after antiinflammatories and muscle relaxants only to return on L anterior chest and worsening over 10 days. No exertional symptoms. Slightly worse with deep breaths. "My whole body feels sore". Home stress- doesn't want to discuss details. Reproducible to palpation. Repeat EKG without acute changes. 3 Troponin's - neg. Atypical/Muscular type CP. Trial of IV Ketorolac. Cards input pending.  Full note to follow.  Discussed with sister at bedside.  Marcellus ScottHONGALGI,Saed Hudlow, MD, FACP, FHM. [facility] Pager [phone]  If 7PM-7AM, please contact night-coverage [email or web address] Password [email or web address] 11/04/2014, 11:05 AM

## 2014-11-04 NOTE — Discharge Summary (Signed)
Physician Discharge Summary  Barbara Gonzalez UJW:[id number] DOB: January 12, 1961 DOA: 11/04/2014  PCP: Johny Blamer, MD  Admit date: 11/04/2014 Discharge date: 11/04/2014  Time spent: Less than 30 minutes  Recommendations for Outpatient Follow-up:  1. Dr. Johny Blamer, PCP on 11/09/14. Patient advised to keep previous appointment. 2. [facility]: Cardiologist office will arrange for outpatient stress test and follow-up.  Discharge Diagnoses:  Active Problems:   Chest pain   Chest pain with low risk for cardiac etiology   Muscular chest pain   Discharge Condition: Improved & Stable  Diet recommendation: Heart healthy diet.  Filed Weights   11/04/14 0430  Weight: 77.701 kg (171 lb 4.8 oz)    History of present illness & Hospital course:  54 year old female patient, single and lives alone, with history of HTN and no other significant past medical history, lifelong nonsmoker, no family history of heart disease, presented to the [facility] ED on 11/04/14 with 3 week history of intermittent chest pains. She reported chest pain initially on right anterior chest. She was seen at an urgent care center and treated empirically with anti-inflammatories and muscle relaxants with improvement. Approximately 10 days ago, she started noticing left anterior chest pain, left upper back pain and left shoulder pain. She denied exertional nature to these symptoms or relationship to by mouth intake. She denies history of GERD. She denied recent long-distance travel, contraception or prior VTE. She did indicate that the pain was slightly worse with deep breaths and described pain as pressure-like and 6/10 in severity. No radiation of pain. She denied history of recent heavy lifting. She denied dyspnea, cough or fever. She was stressed at home but declined to volunteer the reason to this M.D. However to the cardiologist she indicated that she had a confrontation with a coworker several weeks ago leading to  anxiety and she ran out of her PRN lorazepam 3 weeks ago. Her sister at bedside also indicated that she has been under a lot of stress and was not sleeping well.  Patient was observed on telemetry which did not show any arrhythmia alarms. EKG repeated and without acute changes. Troponin 3 negative. Physical exam was remarkable for clearly reproducible left upper anterior chest pain. Trial of a dose of IV Ketorolac with minimal relief. Cardiology was consulted and stated that she had minimal cardiac risk factors other than true hypertension. Her pain was felt to be noncardiac/muscular in etiology. Cardiology cleared her for discharge home on NSAID's, antianxiety medications and antireflux medications. She was advised to seek immediate medical attention if there was any decline in her condition and she verbalized understanding.    Consultations:  Cardiology  Procedures:  None    Discharge Exam:  Complaints:  Left upper anterior chest pain, left upper back pain and left shoulder pain-intermittent, pressure-like, 6/10 in severity. Denies dyspnea, cough, fever or chills.  Filed Vitals:   11/04/14 0310 11/04/14 0318 11/04/14 0417 11/04/14 0430  BP: 123/78 111/72 117/81 126/83  Pulse: 100 91 77 83  Temp:   98.8 F (37.1 C) 98.7 F (37.1 C)  TempSrc:   Oral Oral  Resp:   18 18  Height:     (1.575 m)  Weight:    77.701 kg (171 lb 4.8 oz)  SpO2:   100% 98%    General exam: Pleasant young female, moderately built and nourished, anxious appearing but in no obvious distress. Respiratory system: Clear. No increased work of breathing. Reproducible left upper anterior chest wall tenderness. Cardiovascular  system: S1 & S2 heard, RRR. No JVD, murmurs, gallops, clicks or pedal edema. Telemetry: Sinus rhythm without arrhythmias. Gastrointestinal system: Abdomen is nondistended, soft and nontender. Normal bowel sounds heard. Central nervous system: Alert and oriented. No focal neurological  deficits. Extremities: Symmetric 5 x 5 power. Left shoulder without acute findings.  Discharge Instructions      Discharge Instructions    Call MD for:  difficulty breathing, headache or visual disturbances    Complete by:  As directed      Call MD for:  severe uncontrolled pain    Complete by:  As directed      Diet - low sodium heart healthy    Complete by:  As directed      Increase activity slowly    Complete by:  As directed             Medication List    TAKE these medications        doxylamine (Sleep) 25 MG tablet  Commonly known as:  UNISOM  Take 50 mg by mouth at bedtime as needed for sleep.     ibuprofen 200 MG tablet  Commonly known as:  ADVIL  Take 3 tablets (600 mg total) by mouth every 6 (six) hours as needed (pain).     lisinopril-hydrochlorothiazide 20-25 MG per tablet  Commonly known as:  PRINZIDE,ZESTORETIC  Take 1 tablet by mouth daily.     LORazepam 1 MG tablet  Commonly known as:  ATIVAN  Take 1 tablet (1 mg total) by mouth 2 (two) times daily as needed for anxiety.     omeprazole 20 MG capsule  Commonly known as:  PRILOSEC  Take 1 capsule (20 mg total) by mouth daily.     traZODone 100 MG tablet  Commonly known as:  DESYREL  Take 100 mg by mouth at bedtime.       Follow-up Information    Follow up with Johny BlamerHARRIS, WILLIAM, MD On 11/09/2014.   Specialty:  Family Medicine   Why:  Keep previous appointment. Hospital follow up.   Contact information:   [address] [place] [address] [phone]        The results of significant diagnostics from this hospitalization (including imaging, microbiology, ancillary and laboratory) are listed below for reference.    Significant Diagnostic Studies: Dg Chest 2 View  11/04/2014   CLINICAL DATA:  Left upper chest pain for the past 10 days, worst this morning. Initial encounter.  EXAM: CHEST  2 VIEW  COMPARISON:  None.  FINDINGS: No cardiomegaly. There is mild aortic tortuosity  without evidence of enlargement. The hila are negative. There is no edema, consolidation, effusion, or pneumothorax. No osseous findings to explain pain.  IMPRESSION: No active cardiopulmonary disease.   Electronically Signed   By: Marnee SpringJonathon  Watts M.D.   On: 11/04/2014 02:39    Microbiology: No results found for this or any previous visit (from the past 240 hour(s)).   Labs: Basic Metabolic Panel:  Recent Labs Lab 11/04/14 0252 11/04/14 0259  NA 139 139  K 4.2 5.3*  CL 105 102  CO2 26  --   GLUCOSE 98 98  BUN 14 18  CREATININE 1.06 1.10  CALCIUM 9.1  --    Liver Function Tests: No results for input(s): AST, ALT, ALKPHOS, BILITOT, PROT, ALBUMIN in the last 168 hours. No results for input(s): LIPASE, AMYLASE in the last 168 hours. No results for input(s): AMMONIA in the last 168 hours. CBC:  Recent Labs Lab 11/04/14 0252 11/04/14 0259  WBC 11.4*  --   NEUTROABS 7.4  --   HGB 13.2 15.0  HCT 39.6 44.0  MCV 95.9  --   PLT 290  --    Cardiac Enzymes:  Recent Labs Lab 11/04/14 0600 11/04/14 0908 11/04/14 1218  TROPONINI <0.03 <0.03 <0.03   BNP: BNP (last 3 results) No results for input(s): BNP in the last 8760 hours.  ProBNP (last 3 results) No results for input(s): PROBNP in the last 8760 hours.  CBG: No results for input(s): GLUCAP in the last 168 hours.      Signed:  Marcellus Scott, MD, FACP, FHM. [facility] Pager [phone]  If 7PM-7AM, please contact night-coverage [email or web address] Password [email or web address] 11/04/2014, 1:31 PM

## 2014-11-04 NOTE — Consult Note (Signed)
Reason for Consult: Atypical chest pain  Requesting Physician: Dr. Algis Liming   HPI: Barbara Gonzalez is a 54 year old fit-appearing single African-American female with no children who works doing Chief Executive Officer at [employer]. Her primary care physician is Dr. Azalia Bilis. She was admitted atypical chest pain. Her only cardiac risk factor is 2 hypertension. The pain began several weeks ago and has been constant over the last week or so. There is no radiation other than some associated back pain. There is no relationship to activity his addition or breathing. Her EKG shows no acute changes and her enzymes are negative. She does admit to having a lot of anxiety and is on lorazepam at home for this. She's been out of her lorazepam for the last 3 weeks and does admit to a confrontation with a coworker several weeks ago which caused increased anxiety.  Problem List: Patient Active Problem List   Diagnosis Date Noted  . Chest pain 11/04/2014  . Chest pain with low risk for cardiac etiology 11/04/2014  . Muscular chest pain     PMHx:  Past Medical History  Diagnosis Date  . Hypertension   . Allergy    History reviewed. No pertinent past surgical history.  FAMHx: Family History  Problem Relation Age of Onset  . Breast cancer Mother 30  . Thyroid cancer Mother 46  . Colon cancer Mother 71  . Breast cancer Sister 81    maternal half sister  . BRCA 1/2 Sister     BRCA2 positive  . Cancer Maternal Aunt     unknown cancer  . Kidney cancer Maternal Uncle   . Colon cancer Maternal Grandmother   . Cancer Maternal Aunt     unknown cancer  . Breast cancer Other     3 maternal great aunts with breast cancer    SOCHx:  reports that she has never smoked. She has never used smokeless tobacco. She reports that she drinks alcohol. She reports that she does not use illicit drugs.  ALLERGIES: Allergies  Allergen Reactions  . Sulfa Antibiotics Itching    ROS: Pertinent items are  noted in HPI.  HOME MEDICATIONS: Prescriptions prior to admission  Medication Sig Dispense Refill Last Dose  . doxylamine, Sleep, (UNISOM) 25 MG tablet Take 50 mg by mouth at bedtime as needed for sleep.   11/03/2014 at Unknown time  . lisinopril-hydrochlorothiazide (PRINZIDE,ZESTORETIC) 20-25 MG per tablet Take 1 tablet by mouth daily.  6 11/03/2014 at Unknown time  . traZODone (DESYREL) 100 MG tablet Take 100 mg by mouth at bedtime.   1 week ago  . LORazepam (ATIVAN) 1 MG tablet Take 1 tablet by mouth 2 (two) times daily as needed for anxiety.   0 3 weeks ago    HOSPITAL MEDICATIONS: I have reviewed the patient's current medications.  VITALS: Blood pressure 126/83, pulse 83, temperature 98.7 F (37.1 C), temperature source Oral, resp. rate 18, height _0  (1.575 m), weight 171 lb 4.8 oz (77.701 kg), SpO2 98 %.  INPUT/OUTPUT        PHYSICAL EXAM: General appearance: alert and no distress Neck: no adenopathy, no carotid bruit, no JVD, supple, symmetrical, trachea midline and thyroid not enlarged, symmetric, no tenderness/mass/nodules Lungs: clear to auscultation bilaterally Heart: regular rate and rhythm, S1, S2 normal, no murmur, click, rub or gallop Extremities: extremities normal, atraumatic, no cyanosis or edema and 2+ pedal pulses bilaterally  LABS:  BMP  Recent Labs  11/04/14 0252 11/04/14 0259  NA  139 139  K 4.2 5.3*  CL 105 102  CO2 26  --   GLUCOSE 98 98  BUN 14 18  CREATININE 1.06 1.10  CALCIUM 9.1  --   GFRNONAA 59*  --   GFRAA 68*  --     CBC  Recent Labs Lab 11/04/14 0252 11/04/14 0259  WBC 11.4*  --   RBC 4.13  --   HGB 13.2 15.0  HCT 39.6 44.0  PLT 290  --   MCV 95.9  --     HEMOGLOBIN A1C No results found for: HGBA1C, MPG  Cardiac Panel (last 3 results)  Recent Labs  11/04/14 0600 11/04/14 0908  TROPONINI <0.03 <0.03    BNP (last 3 results) No results for input(s): PROBNP in the last 8760 hours.  TSH No results for  input(s): TSH in the last 8760 hours.  CHOLESTEROL No results for input(s): CHOL in the last 8760 hours.  Hepatic Function Panel No results for input(s): PROT, ALBUMIN, AST, ALT, ALKPHOS, BILITOT, BILIDIR, IBILI in the last 8760 hours.  IMAGING: Dg Chest 2 View  11/04/2014   CLINICAL DATA:  Left upper chest pain for the past 10 days, worst this morning. Initial encounter.  EXAM: CHEST  2 VIEW  COMPARISON:  None.  FINDINGS: No cardiomegaly. There is mild aortic tortuosity without evidence of enlargement. The hila are negative. There is no edema, consolidation, effusion, or pneumothorax. No osseous findings to explain pain.  IMPRESSION: No active cardiopulmonary disease.   Electronically Signed   By: Monte Fantasia M.D.   On: 11/04/2014 02:39   EKG- normal sinus rhythm at 71 without ST or T-wave changes. I personally reviewed this EKG  IMPRESSION: 1. Atypical chest pain-the patient has minimal cardiac risk factors other than true hypertension. She does admit to anxiety on lorazepam which she ran out of 3 weeks ago. The pain is constant last for days at a time. There is no relation to activity position breathing or congestion. Her objective data is all negative including EKG and troponins. Her pain sounds noncardiac and I think she can have a routine stress test as an outpatient and be discharged home on antireflux measures as well as nonsteroidals and anti-anxiety medications   RECOMMENDATION: 1. Routine GXT as outpatient 2. Antireflux measures, initiation of her outpatient antianxiety medications.  Time Spent Directly with Patient: 20 minutes  Bridgette Wolden J 11/04/2014, 12:58 PM

## 2014-11-06 ENCOUNTER — Other Ambulatory Visit: Payer: Self-pay | Admitting: Physician Assistant

## 2014-11-06 DIAGNOSIS — R0789 Other chest pain: Secondary | ICD-10-CM

## 2014-11-09 ENCOUNTER — Telehealth (HOSPITAL_COMMUNITY): Payer: Self-pay | Admitting: *Deleted

## 2014-11-23 ENCOUNTER — Telehealth (HOSPITAL_COMMUNITY): Payer: Self-pay | Admitting: *Deleted

## 2015-12-20 DIAGNOSIS — R609 Edema, unspecified: Secondary | ICD-10-CM | POA: Diagnosis not present

## 2015-12-20 DIAGNOSIS — F419 Anxiety disorder, unspecified: Secondary | ICD-10-CM | POA: Diagnosis not present

## 2015-12-20 DIAGNOSIS — I10 Essential (primary) hypertension: Secondary | ICD-10-CM | POA: Diagnosis not present

## 2015-12-20 DIAGNOSIS — G479 Sleep disorder, unspecified: Secondary | ICD-10-CM | POA: Diagnosis not present

## 2016-07-27 DIAGNOSIS — Z6834 Body mass index (BMI) 34.0-34.9, adult: Secondary | ICD-10-CM | POA: Diagnosis not present

## 2016-07-27 DIAGNOSIS — Z01419 Encounter for gynecological examination (general) (routine) without abnormal findings: Secondary | ICD-10-CM | POA: Diagnosis not present

## 2016-07-27 DIAGNOSIS — Z13820 Encounter for screening for osteoporosis: Secondary | ICD-10-CM | POA: Diagnosis not present

## 2016-07-27 DIAGNOSIS — Z1231 Encounter for screening mammogram for malignant neoplasm of breast: Secondary | ICD-10-CM | POA: Diagnosis not present

## 2016-08-07 DIAGNOSIS — F419 Anxiety disorder, unspecified: Secondary | ICD-10-CM | POA: Diagnosis not present

## 2016-08-07 DIAGNOSIS — I10 Essential (primary) hypertension: Secondary | ICD-10-CM | POA: Diagnosis not present

## 2016-08-07 DIAGNOSIS — E559 Vitamin D deficiency, unspecified: Secondary | ICD-10-CM | POA: Diagnosis not present

## 2016-08-07 DIAGNOSIS — G479 Sleep disorder, unspecified: Secondary | ICD-10-CM | POA: Diagnosis not present

## 2016-11-28 ENCOUNTER — Encounter: Payer: Self-pay | Admitting: Urgent Care

## 2016-11-28 ENCOUNTER — Ambulatory Visit (INDEPENDENT_AMBULATORY_CARE_PROVIDER_SITE_OTHER): Payer: BLUE CROSS/BLUE SHIELD | Admitting: Urgent Care

## 2016-11-28 VITALS — BP 135/78 | HR 77 | Temp 98.4°F | Resp 16 | Ht 62.0 in | Wt 166.0 lb

## 2016-11-28 DIAGNOSIS — J301 Allergic rhinitis due to pollen: Secondary | ICD-10-CM | POA: Diagnosis not present

## 2016-11-28 DIAGNOSIS — J3489 Other specified disorders of nose and nasal sinuses: Secondary | ICD-10-CM | POA: Diagnosis not present

## 2016-11-28 DIAGNOSIS — J019 Acute sinusitis, unspecified: Secondary | ICD-10-CM

## 2016-11-28 MED ORDER — METHYLPREDNISOLONE ACETATE 80 MG/ML IJ SUSP
40.0000 mg | Freq: Once | INTRAMUSCULAR | Status: AC
  Administered 2016-11-28: 40 mg via INTRAMUSCULAR

## 2016-11-28 MED ORDER — PSEUDOEPHEDRINE HCL ER 120 MG PO TB12: 120.0000 mg | ORAL_TABLET | Freq: Two times a day (BID) | ORAL | 3 refills | Status: DC

## 2016-11-28 MED ORDER — CETIRIZINE HCL 10 MG PO TABS: 10.0000 mg | ORAL_TABLET | Freq: Every day | ORAL | 11 refills | Status: DC

## 2016-11-28 MED ORDER — AMOXICILLIN-POT CLAVULANATE 500-125 MG PO TABS: 1.0000 | ORAL_TABLET | Freq: Three times a day (TID) | ORAL | 0 refills | Status: DC

## 2016-11-28 NOTE — Progress Notes (Signed)
  MRN: [id number] DOB: [birth year]  Subjective:   Barbara Gonzalez is a 56 y.o. female presenting for chief complaint of Nasal Congestion; Facial Pain; and Headache  Reports 4 day history of sinus pain, nasal congestion, sinus headache, teeth pain, eye burning, dizziness, ear fullness, subjective fever, mild sore throat, mild nausea without vomiting. Patient went to a lawn party this past Friday and had her symptoms start thereafter. Denies chest pain, shob, cough, wheezing, belly pain, rashes. Takes Zyrtec for allergies inconsistently. Has also used Thera-flu with minimal relief. Denies smoking cigarettes.  Barbara Gonzalez has a current medication list which includes the following prescription(s): clonazepam, lisinopril-hydrochlorothiazide, lorazepam, vitamin d (ergocalciferol), and ibuprofen. Also is allergic to sulfa antibiotics.  Barbara Gonzalez  has a past medical history of Allergy and Hypertension. Also  has no past surgical history on file.  Objective:   Vitals: BP 135/78   Pulse 77   Temp 98.4 F (36.9 C) (Oral)   Resp 16   Ht  (1.575 m)   Wt 166 lb (75.3 kg)   SpO2 98%   BMI 30.36 kg/m   Physical Exam  Constitutional: She is oriented to person, place, and time. She appears well-developed and well-nourished.  HENT:  TM's flat but intact bilaterally, no effusions or erythema. Nasal turbinates violaceous and boggy, nasal passages patent. Bilateral maxillary sinus tenderness. Oropharynx clear, mucous membranes moist.  Eyes: Right eye exhibits no discharge. Left eye exhibits no discharge.  Neck: Normal range of motion. Neck supple.  Cardiovascular: Normal rate, regular rhythm and intact distal pulses.  Exam reveals no gallop and no friction rub.   No murmur heard. Pulmonary/Chest: No respiratory distress. She has no wheezes. She has no rales.  Lymphadenopathy:    She has no cervical adenopathy.  Neurological: She is alert and oriented to person, place, and time.  Skin: Skin is warm and dry.    Assessment and Plan :   1. Seasonal allergic rhinitis due to pollen - Start Zyrtec with Sudafed PRN. IM Depo-medrol today. - methylPREDNISolone acetate (DEPO-MEDROL) injection 40 mg; Inject 0.5 mLs (40 mg total) into the muscle once.  2. Acute non-recurrent sinusitis, unspecified location 3. Sinus pain - Advised supportive care but patient insisted that she has a bacterial sinus infection. I offered patient a script for Augmentin and she is to fill this in 3-4 days if no improvement with above treatment plan.  Wallis Bamberg, PA-C [facility] at [facility] [phone] 11/28/2016  6:18 PM

## 2016-11-28 NOTE — Patient Instructions (Addendum)
Allergic Rhinitis Allergic rhinitis is when the mucous membranes in the nose respond to allergens. Allergens are particles in the air that cause your body to have an allergic reaction. This causes you to release allergic antibodies. Through a chain of events, these eventually cause you to release histamine into the blood stream. Although meant to protect the body, it is this release of histamine that causes your discomfort, such as frequent sneezing, congestion, and an itchy, runny nose. What are the causes? Seasonal allergic rhinitis (hay fever) is caused by pollen allergens that may come from grasses, trees, and weeds. Year-round allergic rhinitis (perennial allergic rhinitis) is caused by allergens such as house dust mites, pet dander, and mold spores. What are the signs or symptoms?  Nasal stuffiness (congestion).  Itchy, runny nose with sneezing and tearing of the eyes. How is this diagnosed? Your health care provider can help you determine the allergen or allergens that trigger your symptoms. If you and your health care provider are unable to determine the allergen, skin or blood testing may be used. Your health care provider will diagnose your condition after taking your health history and performing a physical exam. Your health care provider may assess you for other related conditions, such as asthma, pink eye, or an ear infection. How is this treated? Allergic rhinitis does not have a cure, but it can be controlled by:  Medicines that block allergy symptoms. These may include allergy shots, nasal sprays, and oral antihistamines.  Avoiding the allergen.  Hay fever may often be treated with antihistamines in pill or nasal spray forms. Antihistamines block the effects of histamine. There are over-the-counter medicines that may help with nasal congestion and swelling around the eyes. Check with your health care provider before taking or giving this medicine. If avoiding the allergen or the  medicine prescribed do not work, there are many new medicines your health care provider can prescribe. Stronger medicine may be used if initial measures are ineffective. Desensitizing injections can be used if medicine and avoidance does not work. Desensitization is when a patient is given ongoing shots until the body becomes less sensitive to the allergen. Make sure you follow up with your health care provider if problems continue. Follow these instructions at home: It is not possible to completely avoid allergens, but you can reduce your symptoms by taking steps to limit your exposure to them. It helps to know exactly what you are allergic to so that you can avoid your specific triggers. Contact a health care provider if:  You have a fever.  You develop a cough that does not stop easily (persistent).  You have shortness of breath.  You start wheezing.  Symptoms interfere with normal daily activities. This information is not intended to replace advice given to you by your health care provider. Make sure you discuss any questions you have with your health care provider. Document Released: 04/11/2001 Document Revised: 03/17/2016 Document Reviewed: 03/24/2013 Elsevier Interactive Patient Education  2017 Elsevier Inc.    Sinusitis, Adult Sinusitis is soreness and inflammation of your sinuses. Sinuses are hollow spaces in the bones around your face. Your sinuses are located:  Around your eyes.  In the middle of your forehead.  Behind your nose.  In your cheekbones.  Your sinuses and nasal passages are lined with a stringy fluid (mucus). Mucus normally drains out of your sinuses. When your nasal tissues become inflamed or swollen, the mucus can become trapped or blocked so air cannot flow through your sinuses.   This allows bacteria, viruses, and funguses to grow, which leads to infection. Sinusitis can develop quickly and last for 7?10 days (acute) or for more than 12 weeks (chronic).  Sinusitis often develops after a cold. What are the causes? This condition is caused by anything that creates swelling in the sinuses or stops mucus from draining, including:  Allergies.  Asthma.  Bacterial or viral infection.  Abnormally shaped bones between the nasal passages.  Nasal growths that contain mucus (nasal polyps).  Narrow sinus openings.  Pollutants, such as chemicals or irritants in the air.  A foreign object stuck in the nose.  A fungal infection. This is rare.  What increases the risk? The following factors may make you more likely to develop this condition:  Having allergies or asthma.  Having had a recent cold or respiratory tract infection.  Having structural deformities or blockages in your nose or sinuses.  Having a weak immune system.  Doing a lot of swimming or diving.  Overusing nasal sprays.  Smoking.  What are the signs or symptoms? The main symptoms of this condition are pain and a feeling of pressure around the affected sinuses. Other symptoms include:  Upper toothache.  Earache.  Headache.  Bad breath.  Decreased sense of smell and taste.  A cough that may get worse at night.  Fatigue.  Fever.  Thick drainage from your nose. The drainage is often green and it may contain pus (purulent).  Stuffy nose or congestion.  Postnasal drip. This is when extra mucus collects in the throat or back of the nose.  Swelling and warmth over the affected sinuses.  Sore throat.  Sensitivity to light.  How is this diagnosed? This condition is diagnosed based on symptoms, a medical history, and a physical exam. To find out if your condition is acute or chronic, your health care provider may:  Look in your nose for signs of nasal polyps.  Tap over the affected sinus to check for signs of infection.  View the inside of your sinuses using an imaging device that has a light attached (endoscope).  If your health care provider suspects  that you have chronic sinusitis, you may also:  Be tested for allergies.  Have a sample of mucus taken from your nose (nasal culture) and checked for bacteria.  Have a mucus sample examined to see if your sinusitis is related to an allergy.  If your sinusitis does not respond to treatment and it lasts longer than 8 weeks, you may have an MRI or CT scan to check your sinuses. These scans also help to determine how severe your infection is. In rare cases, a bone biopsy may be done to rule out more serious types of fungal sinus disease. How is this treated? Treatment for sinusitis depends on the cause and whether your condition is chronic or acute. If a virus is causing your sinusitis, your symptoms will go away on their own within 10 days. You may be given medicines to relieve your symptoms, including:  Topical nasal decongestants. They shrink swollen nasal passages and let mucus drain from your sinuses.  Antihistamines. These drugs block inflammation that is triggered by allergies. This can help to ease swelling in your nose and sinuses.  Topical nasal corticosteroids. These are nasal sprays that ease inflammation and swelling in your nose and sinuses.  Nasal saline washes. These rinses can help to get rid of thick mucus in your nose.  If your condition is caused by bacteria, you will   be given an antibiotic medicine. If your condition is caused by a fungus, you will be given an antifungal medicine. Surgery may be needed to correct underlying conditions, such as narrow nasal passages. Surgery may also be needed to remove polyps. Follow these instructions at home: Medicines  Take, use, or apply over-the-counter and prescription medicines only as told by your health care provider. These may include nasal sprays.  If you were prescribed an antibiotic medicine, take it as told by your health care provider. Do not stop taking the antibiotic even if you start to feel better. Hydrate and  Humidify  Drink enough water to keep your urine clear or pale yellow. Staying hydrated will help to thin your mucus.  Use a cool mist humidifier to keep the humidity level in your home above 50%.  Inhale steam for 10-15 minutes, 3-4 times a day or as told by your health care provider. You can do this in the bathroom while a hot shower is running.  Limit your exposure to cool or dry air. Rest  Rest as much as possible.  Sleep with your head raised (elevated).  Make sure to get enough sleep each night. General instructions  Apply a warm, moist washcloth to your face 3-4 times a day or as told by your health care provider. This will help with discomfort.  Wash your hands often with soap and water to reduce your exposure to viruses and other germs. If soap and water are not available, use hand sanitizer.  Do not smoke. Avoid being around people who are smoking (secondhand smoke).  Keep all follow-up visits as told by your health care provider. This is important. Contact a health care provider if:  You have a fever.  Your symptoms get worse.  Your symptoms do not improve within 10 days. Get help right away if:  You have a severe headache.  You have persistent vomiting.  You have pain or swelling around your face or eyes.  You have vision problems.  You develop confusion.  Your neck is stiff.  You have trouble breathing. This information is not intended to replace advice given to you by your health care provider. Make sure you discuss any questions you have with your health care provider. Document Released: 07/17/2005 Document Revised: 03/12/2016 Document Reviewed: 05/12/2015 Elsevier Interactive Patient Education  2017 Elsevier Inc.   IF you received an x-ray today, you will receive an invoice from [facility]. Please contact [facility] at [phone] with questions or concerns regarding your invoice.   IF you received labwork today, you will  receive an invoice from [facility]. Please contact [facility] at [phone] with questions or concerns regarding your invoice.   Our billing staff will not be able to assist you with questions regarding bills from these companies.  You will be contacted with the lab results as soon as they are available. The fastest way to get your results is to activate your My Chart account. Instructions are located on the last page of this paperwork. If you have not heard from us regarding the results in 2 weeks, please contact this office.      

## 2016-11-30 ENCOUNTER — Ambulatory Visit: Payer: BLUE CROSS/BLUE SHIELD

## 2016-11-30 ENCOUNTER — Ambulatory Visit: Payer: BLUE CROSS/BLUE SHIELD | Admitting: Urgent Care

## 2017-07-30 DIAGNOSIS — Z1231 Encounter for screening mammogram for malignant neoplasm of breast: Secondary | ICD-10-CM | POA: Diagnosis not present

## 2017-07-30 DIAGNOSIS — Z01419 Encounter for gynecological examination (general) (routine) without abnormal findings: Secondary | ICD-10-CM | POA: Diagnosis not present

## 2017-07-30 DIAGNOSIS — Z6831 Body mass index (BMI) 31.0-31.9, adult: Secondary | ICD-10-CM | POA: Diagnosis not present

## 2017-08-01 DIAGNOSIS — I10 Essential (primary) hypertension: Secondary | ICD-10-CM | POA: Diagnosis not present

## 2017-08-01 DIAGNOSIS — G479 Sleep disorder, unspecified: Secondary | ICD-10-CM | POA: Diagnosis not present

## 2017-08-01 DIAGNOSIS — E559 Vitamin D deficiency, unspecified: Secondary | ICD-10-CM | POA: Diagnosis not present

## 2018-02-13 DIAGNOSIS — I10 Essential (primary) hypertension: Secondary | ICD-10-CM | POA: Diagnosis not present

## 2018-02-13 DIAGNOSIS — M222X1 Patellofemoral disorders, right knee: Secondary | ICD-10-CM | POA: Diagnosis not present

## 2018-02-13 DIAGNOSIS — F419 Anxiety disorder, unspecified: Secondary | ICD-10-CM | POA: Diagnosis not present

## 2018-02-13 DIAGNOSIS — E559 Vitamin D deficiency, unspecified: Secondary | ICD-10-CM | POA: Diagnosis not present

## 2018-05-24 DIAGNOSIS — K5904 Chronic idiopathic constipation: Secondary | ICD-10-CM | POA: Diagnosis not present

## 2018-05-24 DIAGNOSIS — K649 Unspecified hemorrhoids: Secondary | ICD-10-CM | POA: Diagnosis not present

## 2018-05-24 DIAGNOSIS — K625 Hemorrhage of anus and rectum: Secondary | ICD-10-CM | POA: Diagnosis not present

## 2018-06-24 DIAGNOSIS — K648 Other hemorrhoids: Secondary | ICD-10-CM | POA: Diagnosis not present

## 2018-06-24 DIAGNOSIS — K6289 Other specified diseases of anus and rectum: Secondary | ICD-10-CM | POA: Diagnosis not present

## 2018-06-24 DIAGNOSIS — K5730 Diverticulosis of large intestine without perforation or abscess without bleeding: Secondary | ICD-10-CM | POA: Diagnosis not present

## 2018-06-24 DIAGNOSIS — K625 Hemorrhage of anus and rectum: Secondary | ICD-10-CM | POA: Diagnosis not present

## 2018-06-24 DIAGNOSIS — K621 Rectal polyp: Secondary | ICD-10-CM | POA: Diagnosis not present

## 2018-06-25 DIAGNOSIS — H43391 Other vitreous opacities, right eye: Secondary | ICD-10-CM | POA: Diagnosis not present

## 2018-07-02 DIAGNOSIS — K6289 Other specified diseases of anus and rectum: Secondary | ICD-10-CM | POA: Diagnosis not present

## 2018-07-02 DIAGNOSIS — K621 Rectal polyp: Secondary | ICD-10-CM | POA: Diagnosis not present

## 2019-02-21 DIAGNOSIS — I10 Essential (primary) hypertension: Secondary | ICD-10-CM | POA: Diagnosis not present

## 2019-02-21 DIAGNOSIS — E559 Vitamin D deficiency, unspecified: Secondary | ICD-10-CM | POA: Diagnosis not present

## 2019-02-21 DIAGNOSIS — G479 Sleep disorder, unspecified: Secondary | ICD-10-CM | POA: Diagnosis not present

## 2019-02-21 DIAGNOSIS — N951 Menopausal and female climacteric states: Secondary | ICD-10-CM | POA: Diagnosis not present

## 2019-03-20 DIAGNOSIS — I10 Essential (primary) hypertension: Secondary | ICD-10-CM | POA: Diagnosis not present

## 2019-03-20 DIAGNOSIS — E559 Vitamin D deficiency, unspecified: Secondary | ICD-10-CM | POA: Diagnosis not present

## 2019-04-03 DIAGNOSIS — Z6833 Body mass index (BMI) 33.0-33.9, adult: Secondary | ICD-10-CM | POA: Diagnosis not present

## 2019-04-03 DIAGNOSIS — Z1231 Encounter for screening mammogram for malignant neoplasm of breast: Secondary | ICD-10-CM | POA: Diagnosis not present

## 2019-04-03 DIAGNOSIS — Z01419 Encounter for gynecological examination (general) (routine) without abnormal findings: Secondary | ICD-10-CM | POA: Diagnosis not present

## 2019-09-22 DIAGNOSIS — I10 Essential (primary) hypertension: Secondary | ICD-10-CM | POA: Diagnosis not present

## 2019-09-22 DIAGNOSIS — J0100 Acute maxillary sinusitis, unspecified: Secondary | ICD-10-CM | POA: Diagnosis not present

## 2019-09-22 DIAGNOSIS — F419 Anxiety disorder, unspecified: Secondary | ICD-10-CM | POA: Diagnosis not present

## 2019-09-22 DIAGNOSIS — G479 Sleep disorder, unspecified: Secondary | ICD-10-CM | POA: Diagnosis not present

## 2019-09-22 DIAGNOSIS — Z03818 Encounter for observation for suspected exposure to other biological agents ruled out: Secondary | ICD-10-CM | POA: Diagnosis not present

## 2019-11-10 DIAGNOSIS — H40013 Open angle with borderline findings, low risk, bilateral: Secondary | ICD-10-CM | POA: Diagnosis not present

## 2020-04-07 DIAGNOSIS — Z6832 Body mass index (BMI) 32.0-32.9, adult: Secondary | ICD-10-CM | POA: Diagnosis not present

## 2020-04-07 DIAGNOSIS — Z01419 Encounter for gynecological examination (general) (routine) without abnormal findings: Secondary | ICD-10-CM | POA: Diagnosis not present

## 2020-04-07 DIAGNOSIS — N951 Menopausal and female climacteric states: Secondary | ICD-10-CM | POA: Diagnosis not present

## 2020-04-07 DIAGNOSIS — Z1231 Encounter for screening mammogram for malignant neoplasm of breast: Secondary | ICD-10-CM | POA: Diagnosis not present

## 2020-05-25 DIAGNOSIS — G4700 Insomnia, unspecified: Secondary | ICD-10-CM | POA: Diagnosis not present

## 2020-05-25 DIAGNOSIS — E8941 Symptomatic postprocedural ovarian failure: Secondary | ICD-10-CM | POA: Diagnosis not present

## 2020-06-15 DIAGNOSIS — J302 Other seasonal allergic rhinitis: Secondary | ICD-10-CM | POA: Diagnosis not present

## 2020-06-15 DIAGNOSIS — F419 Anxiety disorder, unspecified: Secondary | ICD-10-CM | POA: Diagnosis not present

## 2020-06-15 DIAGNOSIS — G479 Sleep disorder, unspecified: Secondary | ICD-10-CM | POA: Diagnosis not present

## 2020-06-15 DIAGNOSIS — G43009 Migraine without aura, not intractable, without status migrainosus: Secondary | ICD-10-CM | POA: Diagnosis not present

## 2020-08-18 DIAGNOSIS — H40013 Open angle with borderline findings, low risk, bilateral: Secondary | ICD-10-CM | POA: Diagnosis not present

## 2020-11-09 DIAGNOSIS — G479 Sleep disorder, unspecified: Secondary | ICD-10-CM | POA: Diagnosis not present

## 2020-11-09 DIAGNOSIS — E559 Vitamin D deficiency, unspecified: Secondary | ICD-10-CM | POA: Diagnosis not present

## 2020-11-09 DIAGNOSIS — I10 Essential (primary) hypertension: Secondary | ICD-10-CM | POA: Diagnosis not present

## 2020-11-09 DIAGNOSIS — F419 Anxiety disorder, unspecified: Secondary | ICD-10-CM | POA: Diagnosis not present

## 2021-02-07 ENCOUNTER — Other Ambulatory Visit: Payer: Self-pay | Admitting: Family Medicine

## 2021-02-07 DIAGNOSIS — R590 Localized enlarged lymph nodes: Secondary | ICD-10-CM

## 2021-02-10 ENCOUNTER — Ambulatory Visit
Admission: RE | Admit: 2021-02-10 | Discharge: 2021-02-10 | Disposition: A | Discharge status: Home / Self Care | Payer: BC Managed Care – PPO | Source: Ambulatory Visit | Attending: Family Medicine | Admitting: Family Medicine

## 2021-02-10 DIAGNOSIS — I700 Atherosclerosis of aorta: Secondary | ICD-10-CM | POA: Diagnosis not present

## 2021-02-10 DIAGNOSIS — R590 Localized enlarged lymph nodes: Secondary | ICD-10-CM | POA: Diagnosis not present

## 2021-02-10 MED ORDER — IOPAMIDOL (ISOVUE-300) INJECTION 61%
75.0000 mL | Freq: Once | INTRAVENOUS | Status: AC | PRN
  Administered 2021-02-10: 75 mL via INTRAVENOUS

## 2021-02-18 ENCOUNTER — Encounter (HOSPITAL_COMMUNITY): Payer: Self-pay

## 2021-02-18 ENCOUNTER — Other Ambulatory Visit (HOSPITAL_COMMUNITY): Payer: Self-pay | Admitting: Family Medicine

## 2021-02-18 DIAGNOSIS — R590 Localized enlarged lymph nodes: Secondary | ICD-10-CM

## 2021-02-18 NOTE — Progress Notes (Unsigned)
Patient Demographics  Patient Name  Barbara Gonzalez, Barbara Gonzalez Legal Sex  Female DOB  1960-08-08 SSN  [id number] Address  [address]  [place] [address] Phone  [phone] [facility])  [phone] (Work)  [phone] (Mobile) *Preferred*     RE: Biopsy Received: Today [name], MD  [name] Ok   [place] core R supraclav LAN  Lymphoma v met v other   DDH         Previous Messages    ----- Message -----  From: [name]  Sent: 02/18/2021  12:04 PM EDT  To: [facility] Procedure Requests  Subject: Biopsy                                         Procedure Requested: US Biopsy    Reason for Procedure: Supraclavicular lymphadenopathy    Provider Requesting:   Harlan Stains  Provider Telephone:  [phone]    Other Info:

## 2021-02-24 ENCOUNTER — Other Ambulatory Visit: Payer: Self-pay | Admitting: Radiology

## 2021-02-25 ENCOUNTER — Other Ambulatory Visit: Payer: Self-pay | Admitting: Radiology

## 2021-02-28 ENCOUNTER — Encounter (HOSPITAL_COMMUNITY): Payer: Self-pay

## 2021-02-28 ENCOUNTER — Other Ambulatory Visit: Payer: Self-pay

## 2021-02-28 ENCOUNTER — Ambulatory Visit (HOSPITAL_COMMUNITY)
Admission: RE | Admit: 2021-02-28 | Discharge: 2021-02-28 | Disposition: A | Discharge status: Home / Self Care | Payer: BC Managed Care – PPO | Source: Ambulatory Visit | Attending: Family Medicine | Admitting: Family Medicine

## 2021-02-28 DIAGNOSIS — R590 Localized enlarged lymph nodes: Secondary | ICD-10-CM

## 2021-02-28 MED ORDER — SODIUM CHLORIDE 0.9 % IV SOLN: INTRAVENOUS | Status: DC

## 2021-02-28 MED ORDER — FENTANYL CITRATE (PF) 100 MCG/2ML IJ SOLN
INTRAMUSCULAR | Status: AC | PRN
  Administered 2021-02-28 (×2): 25 ug via INTRAVENOUS

## 2021-02-28 MED ORDER — FENTANYL CITRATE (PF) 100 MCG/2ML IJ SOLN
INTRAMUSCULAR | Status: AC
  Filled 2021-02-28: qty 2

## 2021-02-28 MED ORDER — LIDOCAINE HCL (PF) 1 % IJ SOLN
INTRAMUSCULAR | Status: AC
  Filled 2021-02-28: qty 30

## 2021-02-28 MED ORDER — MIDAZOLAM HCL 2 MG/2ML IJ SOLN
INTRAMUSCULAR | Status: AC
  Filled 2021-02-28: qty 2

## 2021-02-28 MED ORDER — MIDAZOLAM HCL 2 MG/2ML IJ SOLN
INTRAMUSCULAR | Status: AC | PRN
  Administered 2021-02-28: 1 mg via INTRAVENOUS
  Administered 2021-02-28: 0.5 mg via INTRAVENOUS

## 2021-02-28 NOTE — Procedures (Signed)
Interventional Radiology Procedure Note  Procedure: US guided biopsy of right supraclav lymph node.  Findings: Lymph node, not significantly enlarged.  Bounded by subclavian vasculature.  Complications: None Specimen: 4 x 18 g core EBL: None Recommendations: - Bedrest 1 hours.   - Routine wound care - Follow up pathology - Advance diet   Signed,  Gilmer Mor, DO

## 2021-02-28 NOTE — H&P (Signed)
Chief Complaint: Patient was seen in consultation today for right supra clavicular lymph node biopsy at the request of [place]  Referring Physician(s): [place]  Supervising Physician: Corrie Mckusick  Patient Status: [facility] - Out-pt  History of Present Illness: Barbara Gonzalez is a 60 y.o. female   3 weeks ago noted new Rt sided neck LN while putting on lotion. NT no change PCP few days later  CT 02/10/21: IMPRESSION: 1. No acute intrathoracic pathology. 2. Mildly enlarged right supraclavicular lymph node of indeterminate etiology. Clinical correlation is recommended. 3. Aortic Atherosclerosis (ICD10-I70.0).  Pt with known family hx of Breast cancer in mother and GM Sister with Braca 1  Scheduled now for biopsy of R SCLN per Dr Dema Severin  Past Medical History:  Diagnosis Date   Allergy    Hypertension     History reviewed. No pertinent surgical history.  Allergies: Sulfa antibiotics  Medications: Prior to Admission medications   Medication Sig Start Date End Date Taking? Authorizing Provider  clonazePAM (KLONOPIN) 1 MG tablet Take 1 mg by mouth at bedtime. 11/03/16  Yes [provider]  hydrochlorothiazide (HYDRODIURIL) 50 MG tablet Take 50 mg by mouth 3 (three) times a week. 01/22/21  Yes [provider]  lisinopril (ZESTRIL) 20 MG tablet Take 20 mg by mouth daily. 01/22/21  Yes [provider]  traZODone (DESYREL) 50 MG tablet Take 50 mg by mouth at bedtime. 01/02/21  Yes [provider]     Family History  Problem Relation Age of Onset   Breast cancer Mother 108   Thyroid cancer Mother 20   Colon cancer Mother 31   Breast cancer Sister 72       maternal half sister   BRCA 1/2 Sister        BRCA2 positive   Cancer Maternal Aunt        unknown cancer   Kidney cancer Maternal Uncle    Colon cancer Maternal Grandmother    Cancer Maternal Aunt        unknown cancer   Breast cancer Other        3 maternal great aunts with  breast cancer    Social History   Socioeconomic History   Marital status: Single    Spouse name: Not on file   Number of children: Not on file   Years of education: Not on file   Highest education level: Not on file  Occupational History   Not on file  Tobacco Use   Smoking status: Never   Smokeless tobacco: Never  Substance and Sexual Activity   Alcohol use: Yes    Comment: socially   Drug use: No   Sexual activity: Not on file  Other Topics Concern   Not on file  Social History Narrative   Not on file   Social Determinants of Health   Financial Resource Strain: Not on file  Food Insecurity: Not on file  Transportation Needs: Not on file  Physical Activity: Not on file  Stress: Not on file  Social Connections: Not on file    Review of Systems: A 12 point ROS discussed and pertinent positives are indicated in the HPI above.  All other systems are negative.  Review of Systems  Constitutional:  Negative for activity change, fatigue and fever.  Respiratory:  Negative for cough and shortness of breath.   Cardiovascular:  Negative for chest pain.  Gastrointestinal:  Negative for abdominal pain.  Psychiatric/Behavioral:  Negative for behavioral problems and confusion.  Vital Signs: BP (!) 125/95   Pulse 71   Temp 98.2 F (36.8 C) (Oral)   Ht '5\' 3"'  (1.6 m)   Wt 180 lb (81.6 kg)   SpO2 100%   BMI 31.89 kg/m   Physical Exam Vitals reviewed.  HENT:     Mouth/Throat:     Mouth: Mucous membranes are moist.  Cardiovascular:     Rate and Rhythm: Normal rate and regular rhythm.     Heart sounds: Normal heart sounds.  Pulmonary:     Effort: Pulmonary effort is normal.     Breath sounds: Normal breath sounds. No wheezing.  Abdominal:     Palpations: Abdomen is soft.     Tenderness: There is no abdominal tenderness.  Musculoskeletal:        General: Normal range of motion.  Skin:    General: Skin is warm.  Neurological:     Mental Status: She is alert and  oriented to person, place, and time.  Psychiatric:        Behavior: Behavior normal.    Imaging: CT CHEST W CONTRAST  Result Date: 02/13/2021 CLINICAL DATA:  60 year old female with supraclavicular lymphadenopathy. EXAM: CT CHEST WITH CONTRAST TECHNIQUE: Multidetector CT imaging of the chest was performed during intravenous contrast administration. CONTRAST:  49m ISOVUE-300 IOPAMIDOL (ISOVUE-300) INJECTION 61% COMPARISON:  Chest radiograph dated 11/04/2014. FINDINGS: Cardiovascular: There is no cardiomegaly or pericardial effusion. Minimal atherosclerotic calcification of the thoracic aorta. No aneurysmal dilatation. The origins of the great vessels of the aortic arch are patent. No pulmonary artery embolus identified. Mediastinum/Nodes: No hilar or mediastinal adenopathy. The esophagus is grossly unremarkable. No mediastinal fluid collection. Lungs/Pleura: The lungs are clear. There is no pleural effusion pneumothorax. The central airways are patent. Upper Abdomen: No acute abnormality. Musculoskeletal: Mildly enlarged right supraclavicular lymph node measuring 13 mm in short axis. IMPRESSION: 1. No acute intrathoracic pathology. 2. Mildly enlarged right supraclavicular lymph node of indeterminate etiology. Clinical correlation is recommended. 3. Aortic Atherosclerosis (ICD10-I70.0). Electronically Signed   By: AAnner CreteM.D.   On: 02/13/2021 16:34    Labs:  CBC: No results for input(s): WBC, HGB, HCT, PLT in the last 8760 hours.  COAGS: No results for input(s): INR, APTT in the last 8760 hours.  BMP: No results for input(s): NA, K, CL, CO2, GLUCOSE, BUN, CALCIUM, CREATININE, GFRNONAA, GFRAA in the last 8760 hours.  Invalid input(s): CMP  LIVER FUNCTION TESTS: No results for input(s): BILITOT, AST, ALT, ALKPHOS, PROT, ALBUMIN in the last 8760 hours.  TUMOR MARKERS: No results for input(s): AFPTM, CEA, CA199, CHROMGRNA in the last 8760 hours.  Assessment and Plan:  New palpable  Rt SCLN adenopathy x 3 weeks NT no changes CT revealing supraclavicular LAN With strong family Hx Breast Cancer-- Dr WDema Severinrequesting biopsy Risks and benefits of right supraclavicular lymph node biopsy was discussed with the patient and/or patient's family including, but not limited to bleeding, infection, damage to adjacent structures or low yield requiring additional tests.  All of the questions were answered and there is agreement to proceed.  Consent signed and in chart.    Thank you for this interesting consult.  I greatly enjoyed meeting LGraceyn Fodorand look forward to participating in their care.  A copy of this report was sent to the requesting provider on this date.  Electronically Signed: PLavonia Drafts PA-C 02/28/2021, 11:50 AM   I spent a total of  30 Minutes   in face to face in clinical consultation,  greater than 50% of which was counseling/coordinating care for R SCLN bx

## 2021-03-01 LAB — SURGICAL PATHOLOGY

## 2021-04-13 DIAGNOSIS — Z6833 Body mass index (BMI) 33.0-33.9, adult: Secondary | ICD-10-CM | POA: Diagnosis not present

## 2021-04-13 DIAGNOSIS — Z01419 Encounter for gynecological examination (general) (routine) without abnormal findings: Secondary | ICD-10-CM | POA: Diagnosis not present

## 2021-04-13 DIAGNOSIS — Z1231 Encounter for screening mammogram for malignant neoplasm of breast: Secondary | ICD-10-CM | POA: Diagnosis not present

## 2021-05-13 DIAGNOSIS — E559 Vitamin D deficiency, unspecified: Secondary | ICD-10-CM | POA: Diagnosis not present

## 2021-05-13 DIAGNOSIS — H9311 Tinnitus, right ear: Secondary | ICD-10-CM | POA: Diagnosis not present

## 2021-05-13 DIAGNOSIS — I10 Essential (primary) hypertension: Secondary | ICD-10-CM | POA: Diagnosis not present

## 2021-05-13 DIAGNOSIS — I7090 Unspecified atherosclerosis: Secondary | ICD-10-CM | POA: Diagnosis not present

## 2021-05-13 DIAGNOSIS — J0100 Acute maxillary sinusitis, unspecified: Secondary | ICD-10-CM | POA: Diagnosis not present

## 2021-05-13 DIAGNOSIS — R42 Dizziness and giddiness: Secondary | ICD-10-CM | POA: Diagnosis not present

## 2022-02-07 DIAGNOSIS — I7090 Unspecified atherosclerosis: Secondary | ICD-10-CM | POA: Diagnosis not present

## 2022-02-07 DIAGNOSIS — I10 Essential (primary) hypertension: Secondary | ICD-10-CM | POA: Diagnosis not present

## 2022-02-07 DIAGNOSIS — M545 Low back pain, unspecified: Secondary | ICD-10-CM | POA: Diagnosis not present

## 2022-05-05 DIAGNOSIS — Z1231 Encounter for screening mammogram for malignant neoplasm of breast: Secondary | ICD-10-CM | POA: Diagnosis not present

## 2022-05-05 DIAGNOSIS — Z1272 Encounter for screening for malignant neoplasm of vagina: Secondary | ICD-10-CM | POA: Diagnosis not present

## 2022-05-05 DIAGNOSIS — Z01419 Encounter for gynecological examination (general) (routine) without abnormal findings: Secondary | ICD-10-CM | POA: Diagnosis not present

## 2022-05-05 DIAGNOSIS — Z1151 Encounter for screening for human papillomavirus (HPV): Secondary | ICD-10-CM | POA: Diagnosis not present

## 2022-05-05 DIAGNOSIS — Z6835 Body mass index (BMI) 35.0-35.9, adult: Secondary | ICD-10-CM | POA: Diagnosis not present

## 2022-05-08 DIAGNOSIS — Z13820 Encounter for screening for osteoporosis: Secondary | ICD-10-CM | POA: Diagnosis not present

## 2022-08-30 DIAGNOSIS — I10 Essential (primary) hypertension: Secondary | ICD-10-CM | POA: Diagnosis not present

## 2022-08-30 DIAGNOSIS — E559 Vitamin D deficiency, unspecified: Secondary | ICD-10-CM | POA: Diagnosis not present

## 2022-08-30 DIAGNOSIS — I7090 Unspecified atherosclerosis: Secondary | ICD-10-CM | POA: Diagnosis not present

## 2022-08-30 DIAGNOSIS — F411 Generalized anxiety disorder: Secondary | ICD-10-CM | POA: Diagnosis not present

## 2022-08-30 DIAGNOSIS — M545 Low back pain, unspecified: Secondary | ICD-10-CM | POA: Diagnosis not present

## 2022-08-31 ENCOUNTER — Other Ambulatory Visit: Payer: Self-pay | Admitting: Family Medicine

## 2022-08-31 DIAGNOSIS — I7090 Unspecified atherosclerosis: Secondary | ICD-10-CM

## 2022-10-17 ENCOUNTER — Ambulatory Visit
Admission: RE | Admit: 2022-10-17 | Discharge: 2022-10-17 | Disposition: A | Discharge status: Home / Self Care | Payer: No Typology Code available for payment source | Source: Ambulatory Visit | Attending: Family Medicine | Admitting: Family Medicine

## 2022-10-17 DIAGNOSIS — I7090 Unspecified atherosclerosis: Secondary | ICD-10-CM

## 2022-11-27 IMAGING — CT CT CHEST W/ CM
2 of 4 series · 12 of 36 positions shown, 15 images · IV contrast (iopamidol)
Comparison: Chest radiograph dated 11/04/2014.

CLINICAL DATA: 60-year-old female with supraclavicular
lymphadenopathy.

EXAM:
CT CHEST WITH CONTRAST
TECHNIQUE: Multidetector CT imaging of the chest was performed during
intravenous contrast administration.
CONTRAST:  75mL PKIG7Z-411 IOPAMIDOL (PKIG7Z-411) INJECTION 61%

[Series 2: chest 2.00 br40 s3 · axial · 0.64mm/px · z∈[+1752,+2002]mm · 9 of 149 slices shown, 12 images (1 of 2)]
[im 12/149  mediastinal]
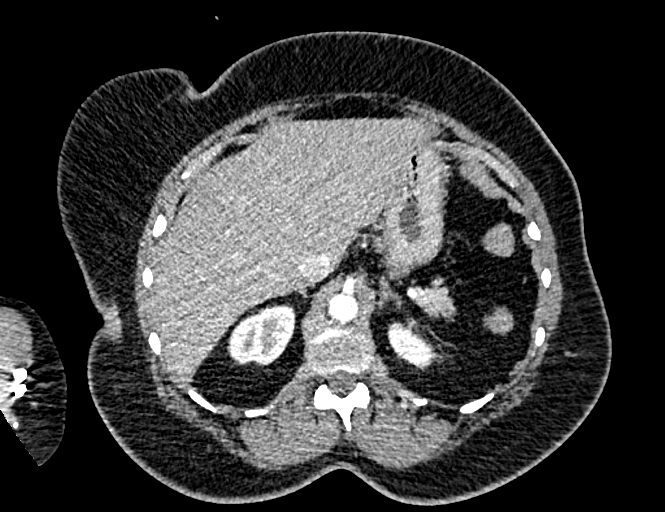
[im 12/149  lung]
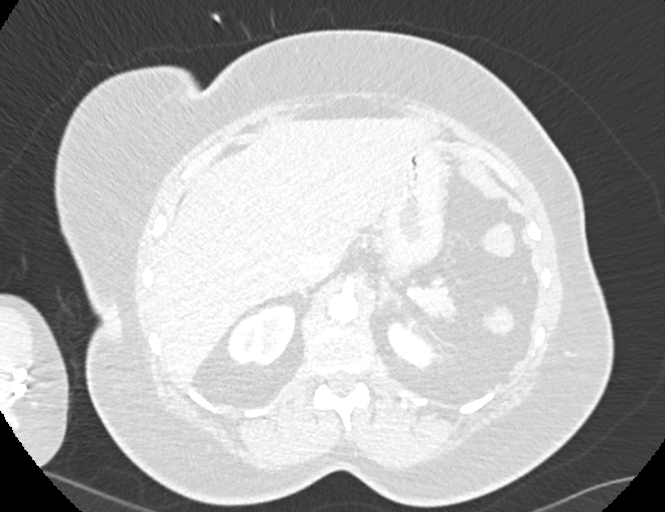
[im 35/149  lung]
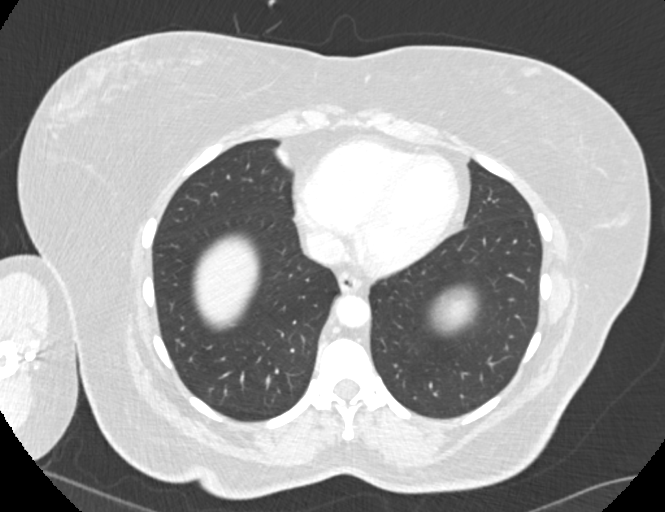
[im 46/149  lung]
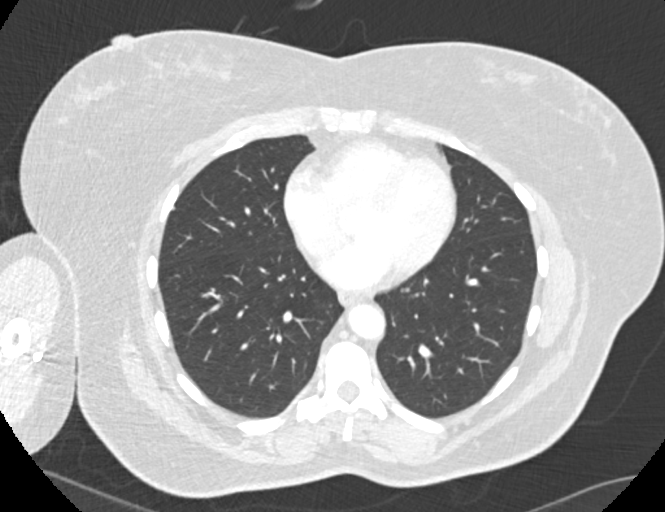
[im 57/149  lung]
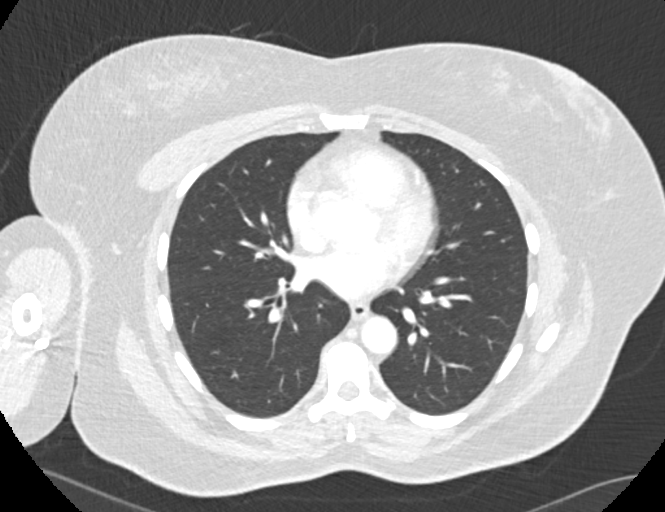
[im 80/149  mediastinal]
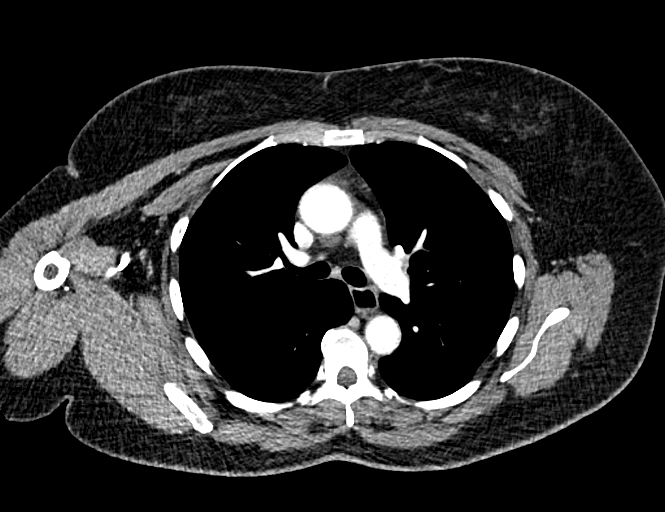
[im 80/149  lung]
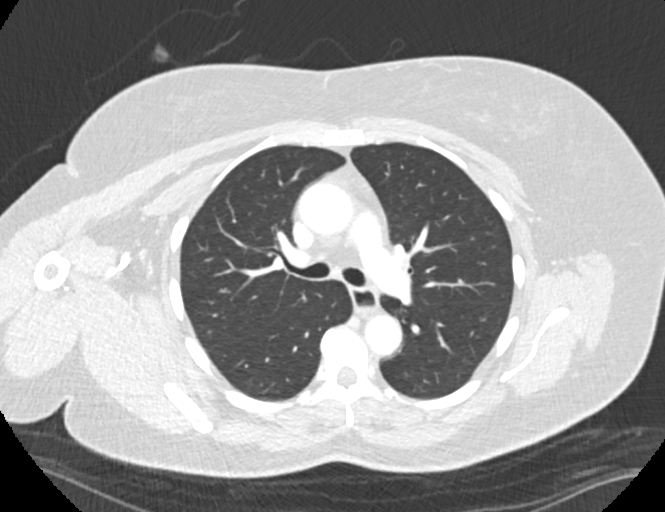
[im 92/149  lung]
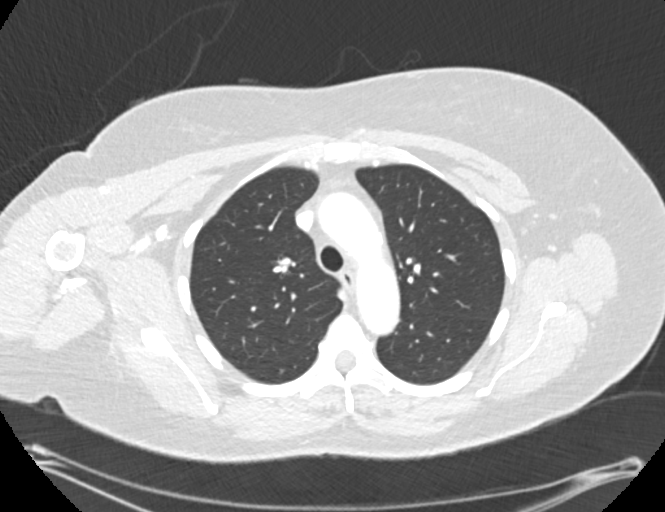
[im 103/149  lung]
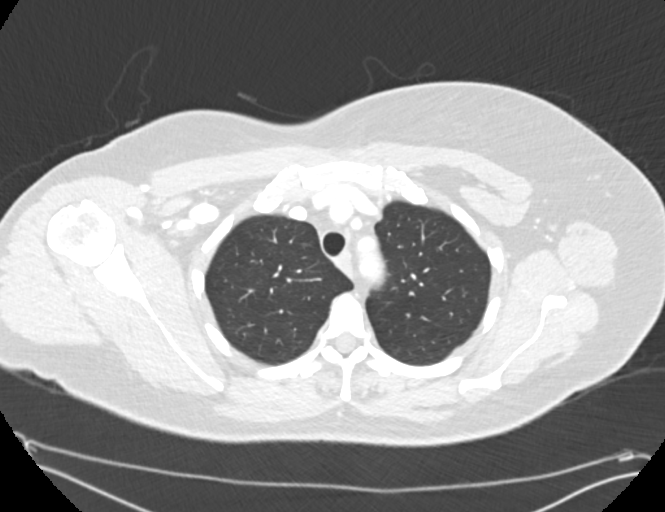
[im 126/149  lung]
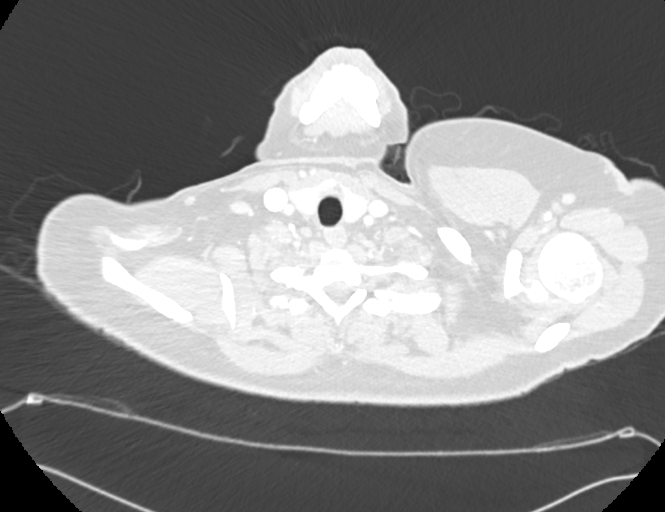
[im 137/149  mediastinal]
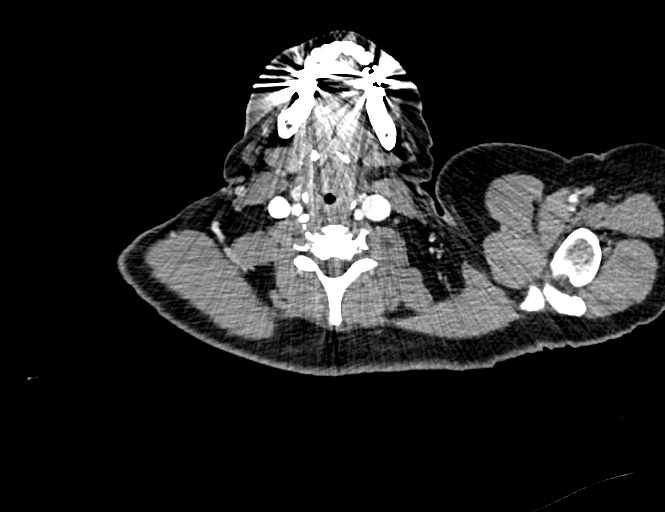
[im 137/149  lung]
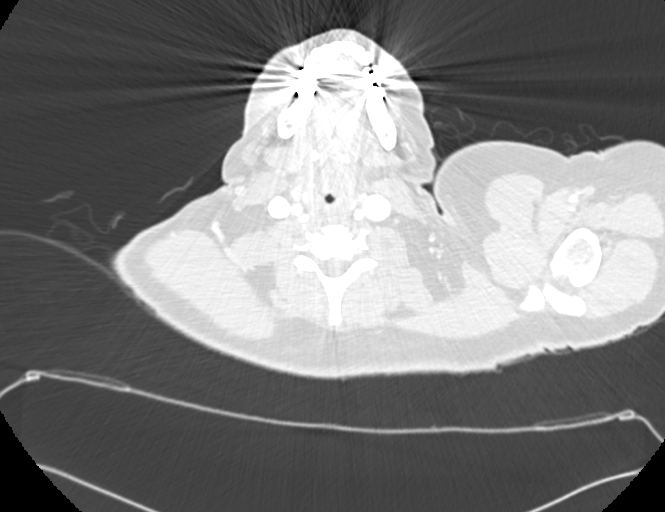

[Series 4: chest 2.00 br40 s3 · coronal · 0.59mm/px · 3 of 163 slices shown (2 of 2)]
[im 33/163  lung]
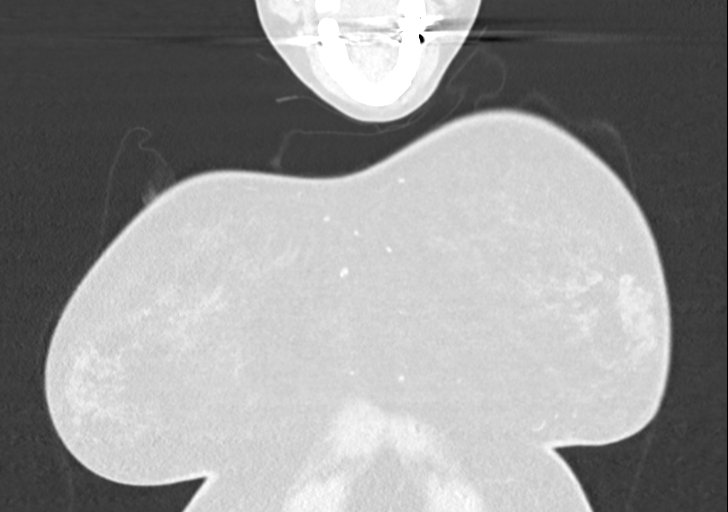
[im 65/163  lung]
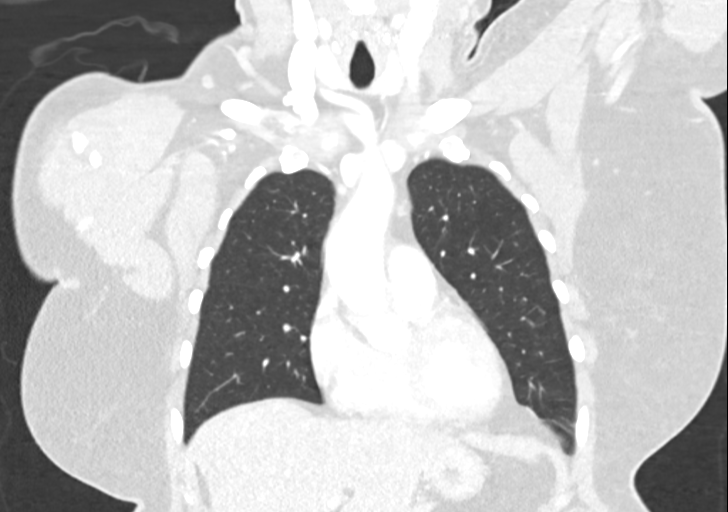
[im 98/163  lung]
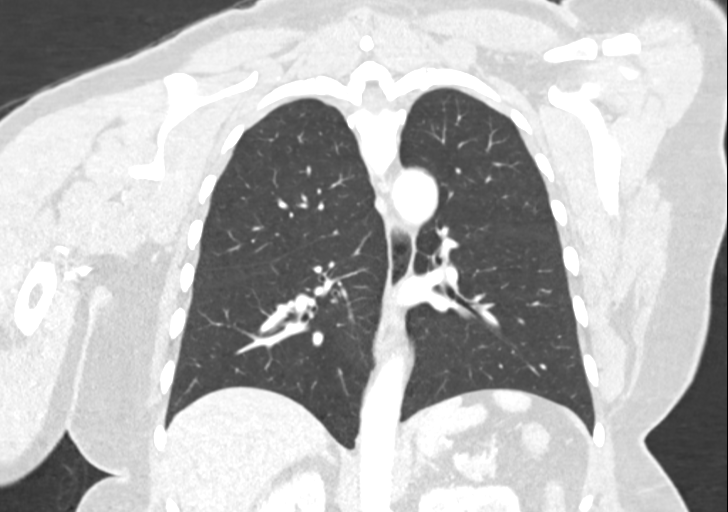

[12 of 36 positions shown; findings below may reference images not displayed]

FINDINGS: Cardiovascular: There is no cardiomegaly or pericardial effusion.
Minimal atherosclerotic calcification of the thoracic aorta. No
aneurysmal dilatation. The origins of the great vessels of the
aortic arch are patent. No pulmonary artery embolus identified.

Mediastinum/Nodes: No hilar or mediastinal adenopathy. The esophagus
is grossly unremarkable. No mediastinal fluid collection.

Lungs/Pleura: The lungs are clear. There is no pleural effusion
pneumothorax. The central airways are patent.

Upper Abdomen: No acute abnormality.

Musculoskeletal: Mildly enlarged right supraclavicular lymph node
measuring 13 mm in short axis.
IMPRESSION: 1. No acute intrathoracic pathology.
2. Mildly enlarged right supraclavicular lymph node of indeterminate
etiology. Clinical correlation is recommended.
3. Aortic Atherosclerosis ([id number]).

## 2023-03-01 DIAGNOSIS — R600 Localized edema: Secondary | ICD-10-CM | POA: Diagnosis not present

## 2023-03-01 DIAGNOSIS — E7800 Pure hypercholesterolemia, unspecified: Secondary | ICD-10-CM | POA: Diagnosis not present

## 2023-03-01 DIAGNOSIS — I10 Essential (primary) hypertension: Secondary | ICD-10-CM | POA: Diagnosis not present

## 2023-03-01 DIAGNOSIS — M545 Low back pain, unspecified: Secondary | ICD-10-CM | POA: Diagnosis not present

## 2023-09-11 DIAGNOSIS — E559 Vitamin D deficiency, unspecified: Secondary | ICD-10-CM | POA: Diagnosis not present

## 2023-09-11 DIAGNOSIS — F411 Generalized anxiety disorder: Secondary | ICD-10-CM | POA: Diagnosis not present

## 2023-09-11 DIAGNOSIS — I10 Essential (primary) hypertension: Secondary | ICD-10-CM | POA: Diagnosis not present

## 2023-09-11 DIAGNOSIS — E7800 Pure hypercholesterolemia, unspecified: Secondary | ICD-10-CM | POA: Diagnosis not present

## 2023-12-21 DIAGNOSIS — Z800 Family history of malignant neoplasm of digestive organs: Secondary | ICD-10-CM | POA: Diagnosis not present

## 2023-12-21 DIAGNOSIS — D120 Benign neoplasm of cecum: Secondary | ICD-10-CM | POA: Diagnosis not present

## 2023-12-21 DIAGNOSIS — Z1211 Encounter for screening for malignant neoplasm of colon: Secondary | ICD-10-CM | POA: Diagnosis not present

## 2024-02-07 DIAGNOSIS — F32A Depression, unspecified: Secondary | ICD-10-CM | POA: Diagnosis not present

## 2024-02-07 DIAGNOSIS — Z1231 Encounter for screening mammogram for malignant neoplasm of breast: Secondary | ICD-10-CM | POA: Diagnosis not present

## 2024-02-07 DIAGNOSIS — Z01419 Encounter for gynecological examination (general) (routine) without abnormal findings: Secondary | ICD-10-CM | POA: Diagnosis not present

## 2024-02-07 DIAGNOSIS — L918 Other hypertrophic disorders of the skin: Secondary | ICD-10-CM | POA: Diagnosis not present

## 2024-02-07 DIAGNOSIS — Z6834 Body mass index (BMI) 34.0-34.9, adult: Secondary | ICD-10-CM | POA: Diagnosis not present

## 2024-02-07 DIAGNOSIS — N9089 Other specified noninflammatory disorders of vulva and perineum: Secondary | ICD-10-CM | POA: Diagnosis not present

## 2024-03-25 DIAGNOSIS — F4320 Adjustment disorder, unspecified: Secondary | ICD-10-CM | POA: Diagnosis not present

## 2024-03-26 DIAGNOSIS — E559 Vitamin D deficiency, unspecified: Secondary | ICD-10-CM | POA: Diagnosis not present

## 2024-03-26 DIAGNOSIS — I10 Essential (primary) hypertension: Secondary | ICD-10-CM | POA: Diagnosis not present

## 2024-03-26 DIAGNOSIS — G44209 Tension-type headache, unspecified, not intractable: Secondary | ICD-10-CM | POA: Diagnosis not present

## 2024-03-26 DIAGNOSIS — F411 Generalized anxiety disorder: Secondary | ICD-10-CM | POA: Diagnosis not present

## 2024-04-22 DIAGNOSIS — F4320 Adjustment disorder, unspecified: Secondary | ICD-10-CM | POA: Diagnosis not present

## 2024-05-20 DIAGNOSIS — F4320 Adjustment disorder, unspecified: Secondary | ICD-10-CM | POA: Diagnosis not present

## 2024-07-10 DIAGNOSIS — M7918 Myalgia, other site: Secondary | ICD-10-CM | POA: Diagnosis not present

## 2024-07-17 DIAGNOSIS — M542 Cervicalgia: Secondary | ICD-10-CM | POA: Diagnosis not present

## 2024-07-17 DIAGNOSIS — R29898 Other symptoms and signs involving the musculoskeletal system: Secondary | ICD-10-CM | POA: Diagnosis not present

## 2024-07-17 DIAGNOSIS — M5030 Other cervical disc degeneration, unspecified cervical region: Secondary | ICD-10-CM | POA: Diagnosis not present

## 2024-07-17 DIAGNOSIS — M79601 Pain in right arm: Secondary | ICD-10-CM | POA: Diagnosis not present
